# Patient Record
Sex: Female | Born: 1980 | Race: White | Hispanic: No | Marital: Married | Smoking: Current every day smoker
Health system: Southern US, Community
[De-identification: ages and names within clinical notes are randomized; demographics above are authoritative.]

## PROBLEM LIST (undated history)

## (undated) DIAGNOSIS — K219 Gastro-esophageal reflux disease without esophagitis: Secondary | ICD-10-CM

## (undated) DIAGNOSIS — F419 Anxiety disorder, unspecified: Secondary | ICD-10-CM

## (undated) DIAGNOSIS — M1990 Unspecified osteoarthritis, unspecified site: Secondary | ICD-10-CM

## (undated) DIAGNOSIS — G8929 Other chronic pain: Secondary | ICD-10-CM

## (undated) DIAGNOSIS — Z9289 Personal history of other medical treatment: Secondary | ICD-10-CM

## (undated) DIAGNOSIS — F329 Major depressive disorder, single episode, unspecified: Secondary | ICD-10-CM

## (undated) DIAGNOSIS — J302 Other seasonal allergic rhinitis: Secondary | ICD-10-CM

## (undated) DIAGNOSIS — F319 Bipolar disorder, unspecified: Secondary | ICD-10-CM

## (undated) DIAGNOSIS — E782 Mixed hyperlipidemia: Secondary | ICD-10-CM

## (undated) DIAGNOSIS — Z8709 Personal history of other diseases of the respiratory system: Secondary | ICD-10-CM

## (undated) DIAGNOSIS — M25569 Pain in unspecified knee: Secondary | ICD-10-CM

## (undated) DIAGNOSIS — K2970 Gastritis, unspecified, without bleeding: Secondary | ICD-10-CM

## (undated) HISTORY — PX: TUBAL LIGATION: SHX77

## (undated) HISTORY — DX: Mixed hyperlipidemia: E78.2

## (undated) HISTORY — DX: Other seasonal allergic rhinitis: J30.2

## (undated) HISTORY — DX: Personal history of other medical treatment: Z92.89

## (undated) HISTORY — DX: Bipolar disorder, unspecified: F31.9

## (undated) HISTORY — DX: Other chronic pain: G89.29

## (undated) HISTORY — DX: Personal history of other diseases of the respiratory system: Z87.09

## (undated) HISTORY — PX: DENTAL RESTORATION/EXTRACTION WITH X-RAY: SHX5796

## (undated) HISTORY — DX: Gastro-esophageal reflux disease without esophagitis: K21.9

## (undated) HISTORY — PX: ABDOMINAL HYSTERECTOMY: SHX81

## (undated) HISTORY — DX: Gastritis, unspecified, without bleeding: K29.70

---
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
## 2010-09-08 ENCOUNTER — Emergency Department (HOSPITAL_COMMUNITY)
Admission: EM | Admit: 2010-09-08 | Discharge: 2010-09-08 | Disposition: A | Discharge status: Home / Self Care | Payer: PRIVATE HEALTH INSURANCE | Attending: Emergency Medicine | Admitting: Emergency Medicine

## 2010-09-08 DIAGNOSIS — A499 Bacterial infection, unspecified: Secondary | ICD-10-CM | POA: Insufficient documentation

## 2010-09-08 DIAGNOSIS — B9689 Other specified bacterial agents as the cause of diseases classified elsewhere: Secondary | ICD-10-CM | POA: Insufficient documentation

## 2010-09-08 DIAGNOSIS — N949 Unspecified condition associated with female genital organs and menstrual cycle: Secondary | ICD-10-CM | POA: Insufficient documentation

## 2010-09-08 DIAGNOSIS — N760 Acute vaginitis: Secondary | ICD-10-CM | POA: Insufficient documentation

## 2010-09-08 LAB — BASIC METABOLIC PANEL
BUN: 18 mg/dL (ref 6–23)
Calcium: 9.9 mg/dL (ref 8.4–10.5)
GFR calc Af Amer: >60 mL/min (ref >60)
GFR calc non Af Amer: >60 mL/min (ref >60)
Glucose, Bld: 95 mg/dL (ref 70–99)
Potassium: 3.8 mEq/L (ref 3.5–5.1)
Sodium: 135 mEq/L (ref 135–145)

## 2010-09-08 LAB — DIFFERENTIAL
Basophils Absolute: 0 10*3/uL (ref 0.0–0.1)
Basophils Relative: 0 % (ref 0–1)
Eosinophils Relative: 5 % (ref 0–5)
Monocytes Absolute: 0.7 10*3/uL (ref 0.1–1.0)
Monocytes Relative: 7 % (ref 3–12)
Neutro Abs: 6.8 10*3/uL (ref 1.7–7.7)

## 2010-09-08 LAB — CBC
HCT: 41.7 % (ref 36.0–46.0)
Hemoglobin: 14.3 g/dL (ref 12.0–15.0)
MCH: 30.5 pg (ref 26.0–34.0)
MCHC: 34.3 g/dL (ref 30.0–36.0)
RDW: 12.5 % (ref 11.5–15.5)

## 2010-09-08 LAB — URINALYSIS, ROUTINE W REFLEX MICROSCOPIC
Bilirubin Urine: NEGATIVE
Ketones, ur: NEGATIVE mg/dL
Leukocytes, UA: NEGATIVE
Nitrite: NEGATIVE
Protein, ur: NEGATIVE mg/dL

## 2010-09-09 LAB — GC/CHLAMYDIA PROBE AMP, GENITAL: Chlamydia, DNA Probe: NEGATIVE

## 2014-07-31 ENCOUNTER — Emergency Department (HOSPITAL_COMMUNITY)
Admission: EM | Admit: 2014-07-31 | Discharge: 2014-07-31 | Disposition: A | Discharge status: Home / Self Care | Payer: Medicare Other | Attending: Emergency Medicine | Admitting: Emergency Medicine

## 2014-07-31 ENCOUNTER — Encounter (HOSPITAL_COMMUNITY): Payer: Self-pay | Admitting: *Deleted

## 2014-07-31 DIAGNOSIS — M25562 Pain in left knee: Secondary | ICD-10-CM | POA: Insufficient documentation

## 2014-07-31 DIAGNOSIS — M25462 Effusion, left knee: Secondary | ICD-10-CM | POA: Insufficient documentation

## 2014-07-31 DIAGNOSIS — Z720 Tobacco use: Secondary | ICD-10-CM | POA: Diagnosis not present

## 2014-07-31 DIAGNOSIS — G8929 Other chronic pain: Secondary | ICD-10-CM | POA: Insufficient documentation

## 2014-07-31 DIAGNOSIS — M25561 Pain in right knee: Secondary | ICD-10-CM | POA: Diagnosis not present

## 2014-07-31 HISTORY — DX: Other chronic pain: G89.29

## 2014-07-31 HISTORY — DX: Pain in unspecified knee: M25.569

## 2014-07-31 MED ORDER — TRAMADOL HCL 50 MG PO TABS
50.0000 mg | ORAL_TABLET | Freq: Once | ORAL | Status: AC
  Administered 2014-07-31: 50 mg via ORAL
  Filled 2014-07-31: qty 1

## 2014-07-31 MED ORDER — IBUPROFEN 600 MG PO TABS: 600.0000 mg | ORAL_TABLET | Freq: Four times a day (QID) | ORAL | Status: DC | PRN

## 2014-07-31 MED ORDER — TRAMADOL HCL 50 MG PO TABS: 50.0000 mg | ORAL_TABLET | Freq: Four times a day (QID) | ORAL | Status: DC | PRN

## 2014-07-31 NOTE — ED Provider Notes (Signed)
CSN: [id number]     Arrival date & time 07/31/14  1242 History  This chart was scribed for non-physician practitioner, Burgess Amor, PA-C working with No att. providers found by Placido Sou, ED scribe. This patient was seen in room APFT22/APFT22 and the patient's care was started at 1:40 PM.   Chief Complaint  Patient presents with  . Knee Pain   The history is provided by the patient. No language interpreter was used.     HPI Comments: Courtney Patterson is a 34 y.o. female with a Hx of chronic knee pain who presents to the Emergency Department complaining of constant, moderate, throbbing bilateral knee pain on the medial aspects of both knees, left worse than right, with onset 5 years prior. She blames her chronic pain on old sports injuries while in high school.  Pt complains of slight edema and intermittent burning elbow pain as associated symptom. Her pain is aggravated by standing and ambulation but subsides once sitting or lying down. Pt's husband massages joints which aggravates pain.  She has tried 800 mg ibuprofen and tylenol arthritis with minimal improvements in pain. Pt used both knee brace and crutches in past with little improvement. Pt was seen by her PCP 3 months prior who noted swelling but xrays completed in his office were normal.  She was told she probably has a ligament injury. Pt is currently unemployed due to back injuries and is on Medicaid. Pt denies new or recent injuries as well as numbness as an associated symptom.   PCP at [facility] in [place].    Past Medical History  Diagnosis Date  . Knee pain, chronic    Past Surgical History  Procedure Laterality Date  . Tubal ligation    . Dental restoration/extraction with x-ray     No family history on file. History  Substance Use Topics  . Smoking status: Current Every Day Smoker  . Smokeless tobacco: Not on file  . Alcohol Use: No   OB History    No data available     Review of Systems   Constitutional: Negative for fever.  Musculoskeletal: Positive for joint swelling and arthralgias. Negative for myalgias.  Neurological: Negative for weakness and numbness.      Allergies  Review of patient's allergies indicates no known allergies.  Home Medications   Prior to Admission medications   Medication Sig Start Date End Date Taking? Authorizing Provider  ibuprofen (ADVIL,MOTRIN) 600 MG tablet Take 1 tablet (600 mg total) by mouth every 6 (six) hours as needed. 07/31/14   Burgess Amor, PA-C  traMADol (ULTRAM) 50 MG tablet Take 1 tablet (50 mg total) by mouth every 6 (six) hours as needed. 07/31/14   Burgess Amor, PA-C   BP 107/70 mmHg  Pulse 95  Temp(Src) 97.9 F (36.6 C) (Oral)  Resp 18  Ht  (1.905 m)  Wt 140 lb (63.504 kg)  BMI 17.50 kg/m2  SpO2 100%  LMP 07/23/2014 Physical Exam  Constitutional: She appears well-developed and well-nourished.  HENT:  Head: Atraumatic.  Neck: Normal range of motion.  Cardiovascular:  Pulses equal bilaterally  Musculoskeletal: She exhibits tenderness.       Right knee: She exhibits no swelling, no effusion, no erythema, no LCL laxity and no MCL laxity. Tenderness found. Medial joint line tenderness noted. No patellar tendon tenderness noted.       Left knee: She exhibits swelling. She exhibits no effusion, no erythema, no LCL laxity and no MCL laxity. Tenderness found. Medial  joint line tenderness noted. No patellar tendon tenderness noted.  Mild crepitus with ROM to her left knee  Neurological: She is alert. She has normal strength. She displays normal reflexes. No sensory deficit.  Skin: Skin is warm and dry.  Psychiatric: She has a normal mood and affect.  Nursing note and vitals reviewed.   ED Course  Procedures   DIAGNOSTIC STUDIES: Oxygen Saturation is 100% on RA, normal by my interpretation.    COORDINATION OF CARE: 1:59 PM Discussed treatment plan with pt at bedside including 600 mg ibuprofen, applying heating pad  and additionally seeing an orthopedist. Pt agreed to plan.  Labs Review Labs Reviewed - No data to display  Imaging Review No results found.   EKG Interpretation None      MDM   Final diagnoses:  Bilateral chronic knee pain    Chronic knee pain with exam suggesting possible meniscal injury, exp with medial ttp, crepitus with ROM. No obvious disruption appreciated.  She was encouraged heat tx, continue with ibuprofen, tramadol also prescribed. Pt was referred to ortho for further management. Encouraged prn crutch use which she owns.  The patient appears reasonably screened and/or stabilized for discharge and I doubt any other medical condition or other [facility] requiring further screening, evaluation, or treatment in the ED at this time prior to discharge.   I personally performed the services described in this documentation, which was scribed in my presence. The recorded information has been reviewed and is accurate.   Burgess AmorJulie Lodema Parma, PA-C 07/31/14 1707  Linwood DibblesJon Knapp, MD 08/03/14 [phone]

## 2014-07-31 NOTE — ED Notes (Signed)
Pt c/o bilateral knee pain, states that she has hx of chronic knee pain and pain became worse two days ago, denies any injury, pt ambulatory to triage, cms intact distal

## 2014-07-31 NOTE — Discharge Instructions (Signed)

## 2014-09-06 ENCOUNTER — Other Ambulatory Visit (HOSPITAL_COMMUNITY): Payer: Self-pay | Admitting: Orthopaedic Surgery

## 2014-09-06 DIAGNOSIS — M25562 Pain in left knee: Secondary | ICD-10-CM

## 2014-09-19 ENCOUNTER — Ambulatory Visit (HOSPITAL_COMMUNITY)
Admission: RE | Admit: 2014-09-19 | Discharge: 2014-09-19 | Disposition: A | Discharge status: Home / Self Care | Payer: Medicare Other | Source: Ambulatory Visit | Attending: Orthopaedic Surgery | Admitting: Orthopaedic Surgery

## 2014-09-19 DIAGNOSIS — S83242A Other tear of medial meniscus, current injury, left knee, initial encounter: Secondary | ICD-10-CM | POA: Insufficient documentation

## 2014-09-19 DIAGNOSIS — X58XXXA Exposure to other specified factors, initial encounter: Secondary | ICD-10-CM | POA: Diagnosis not present

## 2014-09-19 DIAGNOSIS — M25562 Pain in left knee: Secondary | ICD-10-CM | POA: Insufficient documentation

## 2014-11-06 ENCOUNTER — Encounter (HOSPITAL_COMMUNITY): Payer: Self-pay | Admitting: Cardiology

## 2014-11-06 ENCOUNTER — Emergency Department (HOSPITAL_COMMUNITY)
Admission: EM | Admit: 2014-11-06 | Discharge: 2014-11-06 | Disposition: A | Discharge status: Home / Self Care | Payer: Medicare Other | Attending: Emergency Medicine | Admitting: Emergency Medicine

## 2014-11-06 DIAGNOSIS — Y9389 Activity, other specified: Secondary | ICD-10-CM | POA: Insufficient documentation

## 2014-11-06 DIAGNOSIS — G8929 Other chronic pain: Secondary | ICD-10-CM | POA: Diagnosis not present

## 2014-11-06 DIAGNOSIS — M7031 Other bursitis of elbow, right elbow: Secondary | ICD-10-CM | POA: Diagnosis not present

## 2014-11-06 DIAGNOSIS — Z720 Tobacco use: Secondary | ICD-10-CM | POA: Diagnosis not present

## 2014-11-06 DIAGNOSIS — M7021 Olecranon bursitis, right elbow: Secondary | ICD-10-CM

## 2014-11-06 DIAGNOSIS — M222X1 Patellofemoral disorders, right knee: Secondary | ICD-10-CM

## 2014-11-06 DIAGNOSIS — M25521 Pain in right elbow: Secondary | ICD-10-CM | POA: Diagnosis present

## 2014-11-06 MED ORDER — HYDROCODONE-ACETAMINOPHEN 5-325 MG PO TABS: ORAL_TABLET | ORAL | Status: DC

## 2014-11-06 MED ORDER — NAPROXEN 500 MG PO TABS: 500.0000 mg | ORAL_TABLET | Freq: Two times a day (BID) | ORAL | Status: DC

## 2014-11-06 NOTE — ED Notes (Signed)
Pt verbalized understanding of no driving and to use caution within 4 hours of taking pain meds due to meds cause drowsiness 

## 2014-11-06 NOTE — ED Notes (Signed)
Right knee and right elbow pain for a while.  No injury.

## 2014-11-06 NOTE — ED Provider Notes (Signed)
CSN: [id number]     Arrival date & time 11/06/14  1818 History  This chart was scribed for non-physician practitioner, Pauline Aus, PA-C working with Samuel Jester, DO, by Jarvis Morgan, ED Scribe. This patient was seen in room APFT24/APFT24 and the patient's care was started at 7:28 PM.      Chief Complaint  Patient presents with  . Knee Pain  . Elbow Pain    The history is provided by the patient. No language interpreter was used.    HPI Comments: Courtney Patterson is a 34 y.o. female with a h/o chronic knee pain who presents to the Emergency Department complaining of chronic, intermittent, gradually worsening, moderate, bilateral knee pain. She states the right knee is worse than the right. She had an MRI done on her right knee, ordered by Dr. Hilda Lias, but states she has not gotten the results back yet. Pt notes she has not been able to follow up with him lately. She reports the pain is worse with bending down and movement of her knee. Pt admits to intermittent popping sound in knees when walking up and down stairs. She denies any new injury. She denies any weakness or numbness, redness or swelling.  Pt has a second complaint of constant, moderate, burning, right elbow pain onset several days. The pain is non radiating. She is right hand dominant. She denies any new injuries. She states the pain is exacerbated with trying to pick up objects. She denies any numbness or weakness in the elbow or upper extremities.   Past Medical History  Diagnosis Date  . Knee pain, chronic    Past Surgical History  Procedure Laterality Date  . Tubal ligation    . Dental restoration/extraction with x-ray     History reviewed. No pertinent family history. Social History  Substance Use Topics  . Smoking status: Current Every Day Smoker  . Smokeless tobacco: None  . Alcohol Use: No   OB History    No data available     Review of Systems  Musculoskeletal: Positive for myalgias and arthralgias.  Negative for joint swelling and gait problem.  Neurological: Negative for weakness and numbness.  All other systems reviewed and are negative.     Allergies  Review of patient's allergies indicates no known allergies.  Home Medications   Prior to Admission medications   Medication Sig Start Date End Date Taking? Authorizing Provider  ibuprofen (ADVIL,MOTRIN) 600 MG tablet Take 1 tablet (600 mg total) by mouth every 6 (six) hours as needed. 07/31/14   Burgess Amor, PA-C  traMADol (ULTRAM) 50 MG tablet Take 1 tablet (50 mg total) by mouth every 6 (six) hours as needed. 07/31/14   Burgess Amor, PA-C   Triage Vitals: BP 98/75 mmHg  Pulse 69  Temp(Src) 98.1 F (36.7 C) (Oral)  Resp 20  Ht 6\' 3"  (1.905 m)  Wt 145 lb (65.772 kg)  BMI 18.12 kg/m2  SpO2 100%  LMP 11/05/2014  Physical Exam  Constitutional: She is oriented to person, place, and time. She appears well-developed and well-nourished. No distress.  HENT:  Head: Normocephalic and atraumatic.  Eyes: Conjunctivae and EOM are normal.  Neck: Neck supple. No tracheal deviation present.  Cardiovascular: Normal rate.   Pulmonary/Chest: Effort normal. No respiratory distress.  Musculoskeletal: Normal range of motion.       Right elbow: She exhibits no effusion. Tenderness (ulnar aspect) found.       Right knee: She exhibits no effusion and no erythema.  Left knee: Normal.  Pain with ROM of right knee. Patellar crepitus is present  Neurological: She is alert and oriented to person, place, and time.  Skin: Skin is warm and dry.  Psychiatric: She has a normal mood and affect. Her behavior is normal.  Nursing note and vitals reviewed.   ED Course  Procedures (including critical care time)  DIAGNOSTIC STUDIES: Oxygen Saturation is 100% on RA, normal by my interpretation.    COORDINATION OF CARE: 7:55 PM Pt advised of plan for treatment and pt agrees. Advised f/u with Dr. Hilda Lias and to keep alternating heat and ice to help  with pain and inflammation     Labs Review Labs Reviewed - No data to display   EKG Interpretation None      MDM   Final diagnoses:  Patellofemoral disorder of right knee  Bursitis of elbow, right    Pt is well appearing, no concerning sx's for septic joint.  Pt agrees to symptomatic tx and close PMD or orthopedic f/u.  Vitals reviewed, she appears stable for d/c   I personally performed the services described in this documentation, which was scribed in my presence. The recorded information has been reviewed and is accurate.      Pauline Aus, PA-C 11/08/14 1850  Samuel Jester, DO 11/11/14 0006

## 2014-11-06 NOTE — Discharge Instructions (Signed)
Patellofemoral Syndrome If you have had pain in the front of your knee for a long time, chances are good that you have patellofemoral syndrome. The word patella refers to the kneecap. Femoral (or femur) refers to the thigh bone. That is the bone the kneecap sits on. The kneecap is shaped like a triangle. Its job is to protect the knee and to improve the efficiency of your thigh muscles (quadriceps). The underside of the kneecap is made of smooth tissue (cartilage). This lets the kneecap slide up and down as the knee moves. Sometimes this cartilage becomes soft. Your healthcare provider may say the cartilage breaks down. That is patellofemoral syndrome. It can affect one knee, or both. The condition is sometimes called patellofemoral pain syndrome. That is because the condition is painful. The pain usually gets worse with activity. Sitting for a long time with the knee bent also makes the pain worse. It usually gets better with rest and proper treatment. CAUSES  No one is sure why some people develop this problem and others do not. Runners often get it. One name for the condition is "runner's knee." However, some people run for years and never have knee pain. Certain things seem to make patellofemoral syndrome more likely. They include:  Moving out of alignment. The kneecap is supposed to move in a straight line when the thigh muscle pulls on it. Sometimes the kneecap moves in poor alignment. That can make the knee swell and hurt. Some experts believe it also wears down the cartilage.  Injury to the kneecap.  Strain on the knee. This may occur during sports activity. Soccer, running, skiing and cycling can put excess stress on the knee.  Being flat-footed or knock-kneed. SYMPTOMS   Knee pain.  Pain under the kneecap. This is usually a dull, aching pain.  Pain in the knee when doing certain things: squatting, kneeling, going up or down stairs.  Pain in the knee when you stand up after sitting down  for awhile.  Tightness in the knee.  Loss of muscle strength in the thigh.  Swelling of the knee. DIAGNOSIS  Healthcare providers often send people with knee pain to an orthopedic caregiver. This person has special training to treat problems with bones and joints. To decide what is causing your knee pain, your caregiver will probably:  Do a physical exam. This will probably include:  Asking about symptoms you have noticed.  Asking about your activities and any injuries.  Feeling your knee. Moving it. This will help test the knee's strength. It will also check alignment (whether the knee and leg are aligned normally).  Order some tests, such as:  Imaging tests. They create pictures of the inside of the knee. Tests may include:  X-rays.  Computed tomography (CT) scan. This uses X-rays and a computer to show more detail.  Magnetic resonance imaging (MRI). This test uses magnets, radio waves and a computer to make pictures. TREATMENT   Medication is almost always used first. It can relieve pain. It also can reduce swelling. Non-steroidal anti-inflammatory medicines (called NSAIDs) are usually suggested. Sometimes a stronger form is needed. A stronger form would require a prescription.  Other treatment may be needed after the swelling goes down. Possibilities include:  Exercise. Certain exercises can make the muscles around the knee stronger which decreases the pressure on the knee cap. This includes the thigh muscle. Certain exercises also may be suggested to increase your flexibility.  A knee brace. This gives the knee extra support   and helps align the movement of the knee cap.  Orthotics. These are special shoe inserts. They can help keep your leg and knee aligned.  Surgery is sometimes needed. This is rare. Options include:  Arthroscopy. The surgeon uses a special tool to remove any damaged pieces of the kneecap. Only a few small incisions (cuts) are needed.  Realignment.  This is open surgery. The goals are to reduce pressure and fix the way the kneecap moves. HOME CARE INSTRUCTIONS   Take any medication prescribed by your healthcare provider. Follow the directions carefully.  If your knee is swollen:  Put ice or cold packs on it. Do this for 20 to 30 minutes, 3 to 4 times a day.  Keep the knee raised. Make sure it is supported. Put a pillow under it.  Rest your knee. For example, take the elevator instead of the stairs for awhile. Or, take a break from sports activity that strain your knee. Try walking or swimming instead.  Whenever you are active:  Use an elastic bandage on your knee. This gives it support.  After any activity, put ice or cold packs on your knees. Do this for about 10 to 20 minutes.  Make sure you wear shoes that give good support. Make sure they are not worn down. The heels should not slant in or out. SEEK MEDICAL CARE IF:   Knee pain gets worse. Or it does not go away, even after taking pain medicine.  Swelling does not go down.  Your thigh muscle becomes weak.  You have an oral temperature above 102 F (38.9 C). SEEK IMMEDIATE MEDICAL CARE IF:  You have an oral temperature above 102 F (38.9 C), not controlled by medicine. Document Released: 02/12/2009 Document Revised: 05/19/2011 Document Reviewed: 05/16/2013 ExitCare Patient Information 2015 [employer]. This information is not intended to replace advice given to you by your health care provider. Make sure you discuss any questions you have with your health care provider.  

## 2014-12-19 ENCOUNTER — Other Ambulatory Visit (HOSPITAL_COMMUNITY): Payer: Self-pay | Admitting: Orthopaedic Surgery

## 2014-12-19 DIAGNOSIS — M25561 Pain in right knee: Secondary | ICD-10-CM

## 2015-01-10 ENCOUNTER — Other Ambulatory Visit: Payer: Self-pay | Admitting: Orthopaedic Surgery

## 2015-01-19 NOTE — Patient Instructions (Signed)
Courtney Patterson  01/19/2015     @PREFPERIOPPHARMACY @   Your procedure is scheduled on  01/25/2015   Report to [facility] at  710  A.M.  Call this number if you have problems the morning of surgery:  [phone]   Remember:  Do not eat food or drink liquids after midnight.  Take these medicines the morning of surgery with A SIP OF WATER  norco (or ultram).   Do not wear jewelry, make-up or nail polish.  Do not wear lotions, powders, or perfumes.  You may wear deodorant.  Do not shave 48 hours prior to surgery.  Men may shave face and neck.  Do not bring valuables to the hospital.  [facility] not responsible [facility] any belongings or valuables.  Contacts, dentures or bridgework may not be worn into surgery.  Leave your suitcase in the car.  After surgery it may be brought to your room.  For patients admitted to the hospital, discharge time will be determined by your treatment team.  Patients discharged the day of surgery will not be allowed to drive home.   Name and phone number of your driver:   family Special instructions:  none  Please read over the following fact sheets that you were given. Pain Booklet, Coughing and Deep Breathing, MRSA Information, Surgical Site Infection Prevention and Anesthesia Post-op Instructions      Meniscus Injury, Arthroscopy Arthroscopy is a surgical procedure that involves the use of a small scope that has a camera and surgical instruments on the end (arthroscope). An arthroscope can be used to repair your meniscus injury.  LET [facility]ARE PROVIDER KNOW ABOUT:  Any allergies you have.  All medicines you are taking, including vitamins, herbs, eyedrops, creams, and over-the-counter medicines.  Any recent colds or infections you have had or currently have.  Previous problems you or members of your family have had with the use of anesthetics.  Any blood disorders or blood clotting problems you have.  Previous surgeries you  have had.  Medical conditions you have. RISKS AND COMPLICATIONS Generally, this is a safe procedure. However, as with any procedure, problems can occur. Possible problems include:  Damage to nerves or blood vessels.  Excess bleeding.  Blood clots.  Infection. BEFORE THE PROCEDURE  Do not eat or drink for 6-8 hours before the procedure.  Take medicines as directed by your surgeon. Ask your surgeon about changing or stopping your regular medicines.  You may have lab tests the morning of surgery. PROCEDURE  You will be given one of the following:   A medicine that numbs the area (local anesthesia).  A medicine that makes you go to sleep (general anesthesia).  A medicine injected into your spine that numbs your body below the waist (spinal anesthesia). Most often, several small cuts (incisions) are made in the knee. The arthroscope and instruments go into the incisions to repair the damage. The torn portion of the meniscus is removed.  During this time, your surgeon may find a partial or complete tear in a cruciate ligament, such as the anterior cruciate ligament (ACL). A completely torn cruciate ligament is reconstructed by taking tissue from another part of the body (grafting) and placing it into the injured area. This requires several larger incisions to complete the repair. Sometimes, open surgery is needed for collateral ligament injuries. If a collateral ligament is found to be injured, your surgeon may staple or suture the tear through a slightly  larger incision on the side of the knee. AFTER THE PROCEDURE You will be taken to the recovery area where your progress will be monitored. When you are awake, stable, and taking fluids without complications, you will be allowed to go home. This is usually the same day. However, more extensive repairs of a ligament may require an overnight stay.  The recovery time after repairing your meniscus or ligament depends on the amount of damage to  these structures. It also depends on whether or not reconstructive knee surgery was needed.   A torn or stretched ligament (ligament sprain) may take 6-8 weeks to heal. It takes about the same amount of time if your surgeon removed a torn meniscus.  A repaired meniscus may require 6-12 weeks of recovery time.  A torn ligament needing reconstructive surgery may take 6-12 months to heal fully.   This information is not intended to replace advice given to you by your health care provider. Make sure you discuss any questions you have with your health care provider.   Document Released: 02/22/2000 Document Revised: 03/01/2013 Document Reviewed: 07/23/2012 Elsevier Interactive Patient Education 2016 [employer] PATIENT INSTRUCTIONS POST-ANESTHESIA  IMMEDIATELY FOLLOWING SURGERY:  Do not drive or operate machinery for the first twenty four hours after surgery.  Do not make any important decisions for twenty four hours after surgery or while taking narcotic pain medications or sedatives.  If you develop intractable nausea and vomiting or a severe headache please notify your doctor immediately.  FOLLOW-UP:  Please make an appointment with your surgeon as instructed. You do not need to follow up with anesthesia unless specifically instructed to do so.  WOUND CARE INSTRUCTIONS (if applicable):  Keep a dry clean dressing on the anesthesia/puncture wound site if there is drainage.  Once the wound has quit draining you may leave it open to air.  Generally you should leave the bandage intact for twenty four hours unless there is drainage.  If the epidural site drains for more than 36-48 hours please call the anesthesia department.  QUESTIONS?:  Please feel free to call your physician or the hospital operator if you have any questions, and they will be happy to assist you.

## 2015-01-22 ENCOUNTER — Encounter (HOSPITAL_COMMUNITY)
Admission: RE | Admit: 2015-01-22 | Discharge: 2015-01-22 | Disposition: A | Discharge status: Home / Self Care | Payer: Medicare Other | Source: Ambulatory Visit | Attending: Orthopaedic Surgery | Admitting: Orthopaedic Surgery

## 2015-01-22 ENCOUNTER — Encounter (HOSPITAL_COMMUNITY): Payer: Self-pay

## 2015-01-22 DIAGNOSIS — Z01812 Encounter for preprocedural laboratory examination: Secondary | ICD-10-CM | POA: Diagnosis not present

## 2015-01-22 DIAGNOSIS — Z791 Long term (current) use of non-steroidal anti-inflammatories (NSAID): Secondary | ICD-10-CM | POA: Diagnosis not present

## 2015-01-22 DIAGNOSIS — M23221 Derangement of posterior horn of medial meniscus due to old tear or injury, right knee: Secondary | ICD-10-CM | POA: Diagnosis not present

## 2015-01-22 DIAGNOSIS — M23203 Derangement of unspecified medial meniscus due to old tear or injury, right knee: Secondary | ICD-10-CM | POA: Diagnosis present

## 2015-01-22 DIAGNOSIS — F17200 Nicotine dependence, unspecified, uncomplicated: Secondary | ICD-10-CM | POA: Diagnosis not present

## 2015-01-22 LAB — URINALYSIS, ROUTINE W REFLEX MICROSCOPIC
Bilirubin Urine: NEGATIVE
Glucose, UA: NEGATIVE mg/dL
Ketones, ur: NEGATIVE mg/dL
LEUKOCYTES UA: NEGATIVE
Nitrite: NEGATIVE
Protein, ur: NEGATIVE mg/dL
SPECIFIC GRAVITY, URINE: 1.015 (ref 1.005–1.030)
UROBILINOGEN UA: 0.2 mg/dL (ref 0.0–1.0)
pH: 5.5 (ref 5.0–8.0)

## 2015-01-22 LAB — CBC
HCT: 40.1 % (ref 36.0–46.0)
Hemoglobin: 13.3 g/dL (ref 12.0–15.0)
MCH: 31.9 pg (ref 26.0–34.0)
MCHC: 33.2 g/dL (ref 30.0–36.0)
MCV: 96.2 fL (ref 78.0–100.0)
PLATELETS: 211 10*3/uL (ref 150–400)
RBC: 4.17 MIL/uL (ref 3.87–5.11)
RDW: 13.7 % (ref 11.5–15.5)
WBC: 13.1 10*3/uL — AB (ref 4.0–10.5)

## 2015-01-22 LAB — COMPREHENSIVE METABOLIC PANEL
ALT: 18 U/L (ref 14–54)
AST: 21 U/L (ref 15–41)
Albumin: 4 g/dL (ref 3.5–5.0)
Alkaline Phosphatase: 56 U/L (ref 38–126)
Anion gap: 5 (ref 5–15)
BUN: 17 mg/dL (ref 6–20)
CHLORIDE: 103 mmol/L (ref 101–111)
CO2: 28 mmol/L (ref 22–32)
CREATININE: 0.68 mg/dL (ref 0.44–1.00)
Calcium: 8.7 mg/dL — ABNORMAL LOW (ref 8.9–10.3)
GFR calc Af Amer: >60 mL/min (ref >60)
GFR calc non Af Amer: >60 mL/min (ref >60)
Glucose, Bld: 77 mg/dL (ref 65–99)
POTASSIUM: 4 mmol/L (ref 3.5–5.1)
SODIUM: 136 mmol/L (ref 135–145)
Total Bilirubin: 0.3 mg/dL (ref 0.3–1.2)
Total Protein: 6.8 g/dL (ref 6.5–8.1)

## 2015-01-22 LAB — URINE MICROSCOPIC-ADD ON

## 2015-01-22 LAB — HCG, SERUM, QUALITATIVE: PREG SERUM: NEGATIVE

## 2015-01-24 NOTE — H&P (Signed)
Courtney Patterson is an 34 y.o. female.   Chief Complaint:  Right knee pain HPI: she has had pain and tenderness of the right knee going back about two years.   It has had swelling and popping.  Over the last several months the pain got worse and she had more swelling.  She has been on NSAIDs and modification of her activities.She has had giving way also.  [facility] shows a complex tear of the medial meniscus of the right knee.  I have recommended arthroscopy of the knee and partial medial menisectomy.  I have explained the procedure the risks and imponderables to her.  She asked appropriate questions and agrees to the out patient procedure.   Past Medical History  Diagnosis Date  . Knee pain, chronic     Past Surgical History  Procedure Laterality Date  . Tubal ligation    . Dental restoration/extraction with x-ray      No family history on file. Social History:  reports that she has been smoking.  She does not have any smokeless tobacco history on file. She reports that she uses illicit drugs (Marijuana). She reports that she does not drink alcohol.  Allergies: No Known Allergies  No prescriptions prior to admission    No results found for this or any previous visit (from the past 48 hour(s)). No results found.  Review of Systems  Musculoskeletal: Positive for joint pain (Pain of the right knee with giving way for over two years.).    There were no vitals taken for this visit. Physical Exam  Constitutional: She is oriented to person, place, and time. She appears well-developed and well-nourished.  HENT:  Head: Normocephalic and atraumatic.  Eyes: Conjunctivae and EOM are normal. Pupils are equal, round, and reactive to light.  Neck: Normal range of motion. Neck supple.  Cardiovascular: Normal rate, regular rhythm, normal heart sounds and intact distal pulses.   Respiratory: Effort normal and breath sounds normal.  GI: Soft.  Musculoskeletal: She exhibits  tenderness (Pain right knee with medial positive McMurray, effusion, medial joint line pain, ROM 0 to 105).  Neurological: She is alert and oriented to person, place, and time. She has normal reflexes.  Skin: Skin is warm and dry.  Psychiatric: She has a normal mood and affect. Her behavior is normal. Judgment and thought content normal.     Assessment/Plan Tear of the right knee medial meniscus.  For outpatient arthroscopy.  Damyn Weitzel 01/24/2015, 3:41 PM

## 2015-01-25 ENCOUNTER — Encounter (HOSPITAL_COMMUNITY): Payer: Self-pay | Admitting: *Deleted

## 2015-01-25 ENCOUNTER — Ambulatory Visit (HOSPITAL_COMMUNITY)
Admission: RE | Admit: 2015-01-25 | Discharge: 2015-01-25 | Disposition: A | Discharge status: Home / Self Care | Payer: Medicare Other | Source: Ambulatory Visit | Attending: Orthopaedic Surgery | Admitting: Orthopaedic Surgery

## 2015-01-25 ENCOUNTER — Ambulatory Visit (HOSPITAL_COMMUNITY): Payer: Medicare Other | Admitting: Anesthesiology

## 2015-01-25 ENCOUNTER — Encounter (HOSPITAL_COMMUNITY)
Admission: RE | Disposition: A | Discharge status: Home / Self Care | Payer: Self-pay | Source: Ambulatory Visit | Attending: Orthopaedic Surgery

## 2015-01-25 DIAGNOSIS — Z791 Long term (current) use of non-steroidal anti-inflammatories (NSAID): Secondary | ICD-10-CM | POA: Insufficient documentation

## 2015-01-25 DIAGNOSIS — Z01812 Encounter for preprocedural laboratory examination: Secondary | ICD-10-CM | POA: Diagnosis not present

## 2015-01-25 DIAGNOSIS — M23221 Derangement of posterior horn of medial meniscus due to old tear or injury, right knee: Secondary | ICD-10-CM | POA: Diagnosis not present

## 2015-01-25 DIAGNOSIS — F17200 Nicotine dependence, unspecified, uncomplicated: Secondary | ICD-10-CM | POA: Insufficient documentation

## 2015-01-25 HISTORY — PX: KNEE ARTHROSCOPY: SHX127

## 2015-01-25 HISTORY — PX: KNEE ARTHROSCOPY WITH MEDIAL MENISECTOMY: SHX5651

## 2015-01-25 SURGERY — ARTHROSCOPY, KNEE
Anesthesia: General | Laterality: Right

## 2015-01-25 MED ORDER — MIDAZOLAM HCL 2 MG/2ML IJ SOLN
1.0000 mg | INTRAMUSCULAR | Status: DC | PRN
  Administered 2015-01-25: 2 mg via INTRAVENOUS

## 2015-01-25 MED ORDER — MIDAZOLAM HCL 5 MG/5ML IJ SOLN
INTRAMUSCULAR | Status: DC | PRN
  Administered 2015-01-25: 2 mg via INTRAVENOUS

## 2015-01-25 MED ORDER — ONDANSETRON HCL 4 MG/2ML IJ SOLN
INTRAMUSCULAR | Status: AC
  Filled 2015-01-25: qty 2

## 2015-01-25 MED ORDER — BUPIVACAINE HCL (PF) 0.25 % IJ SOLN
INTRAMUSCULAR | Status: AC
  Filled 2015-01-25: qty 30

## 2015-01-25 MED ORDER — FENTANYL CITRATE (PF) 100 MCG/2ML IJ SOLN
INTRAMUSCULAR | Status: AC
  Filled 2015-01-25: qty 4

## 2015-01-25 MED ORDER — LIDOCAINE HCL (PF) 1 % IJ SOLN
INTRAMUSCULAR | Status: AC
  Filled 2015-01-25: qty 5

## 2015-01-25 MED ORDER — CHLORHEXIDINE GLUCONATE 4 % EX LIQD: 60.0000 mL | Freq: Once | CUTANEOUS | Status: DC

## 2015-01-25 MED ORDER — FENTANYL CITRATE (PF) 100 MCG/2ML IJ SOLN
25.0000 ug | INTRAMUSCULAR | Status: DC | PRN
  Administered 2015-01-25: 50 ug via INTRAVENOUS
  Filled 2015-01-25: qty 2

## 2015-01-25 MED ORDER — LIDOCAINE HCL 1 % IJ SOLN
INTRAMUSCULAR | Status: DC | PRN
  Administered 2015-01-25: 30 mg via INTRADERMAL

## 2015-01-25 MED ORDER — FENTANYL CITRATE (PF) 100 MCG/2ML IJ SOLN: INTRAMUSCULAR | Status: DC | PRN

## 2015-01-25 MED ORDER — PROPOFOL 10 MG/ML IV BOLUS
INTRAVENOUS | Status: DC | PRN
  Administered 2015-01-25: 160 mg via INTRAVENOUS

## 2015-01-25 MED ORDER — FENTANYL CITRATE (PF) 100 MCG/2ML IJ SOLN
INTRAMUSCULAR | Status: DC | PRN
  Administered 2015-01-25: 100 ug via INTRAVENOUS
  Administered 2015-01-25 (×2): 50 ug via INTRAVENOUS

## 2015-01-25 MED ORDER — PROPOFOL 10 MG/ML IV BOLUS
INTRAVENOUS | Status: AC
  Filled 2015-01-25: qty 20

## 2015-01-25 MED ORDER — LACTATED RINGERS IR SOLN
Status: DC | PRN
  Administered 2015-01-25 (×3): 3000 mL

## 2015-01-25 MED ORDER — SODIUM CHLORIDE 0.9 % IR SOLN
Status: DC | PRN
Start: 2015-01-25 — End: 2015-01-25
  Administered 2015-01-25: 500 mL

## 2015-01-25 MED ORDER — MIDAZOLAM HCL 2 MG/2ML IJ SOLN
INTRAMUSCULAR | Status: AC
  Filled 2015-01-25: qty 2

## 2015-01-25 MED ORDER — LACTATED RINGERS IV SOLN
INTRAVENOUS | Status: DC
  Administered 2015-01-25: 1000 mL via INTRAVENOUS

## 2015-01-25 MED ORDER — ONDANSETRON HCL 4 MG/2ML IJ SOLN
4.0000 mg | Freq: Once | INTRAMUSCULAR | Status: AC
  Administered 2015-01-25: 4 mg via INTRAVENOUS

## 2015-01-25 MED ORDER — ONDANSETRON HCL 4 MG/2ML IJ SOLN: 4.0000 mg | Freq: Once | INTRAMUSCULAR | Status: DC | PRN

## 2015-01-25 MED ORDER — BUPIVACAINE HCL (PF) 0.25 % IJ SOLN
INTRAMUSCULAR | Status: DC | PRN
Start: 2015-01-25 — End: 2015-01-25
  Administered 2015-01-25: 30 mL

## 2015-01-25 SURGICAL SUPPLY — 56 items
BAG HAMPER (MISCELLANEOUS) ×2 IMPLANT
BANDAGE ELASTIC 6 VELCRO NS (GAUZE/BANDAGES/DRESSINGS) ×2 IMPLANT
BANDAGE ESMARK 4X12 BL STRL LF (DISPOSABLE) ×1 IMPLANT
BLADE SURG 15 STRL LF DISP TIS (BLADE) ×1 IMPLANT
BLADE SURG 15 STRL SS (BLADE) ×1
BLADE SURG SZ11 CARB STEEL (BLADE) ×2 IMPLANT
BNDG ESMARK 4X12 BLUE STRL LF (DISPOSABLE) ×2
CLOTH BEACON ORANGE TIMEOUT ST (SAFETY) ×2 IMPLANT
CUFF TOURNIQUET SINGLE 34IN LL (TOURNIQUET CUFF) ×2 IMPLANT
CUFF TOURNIQUET SINGLE 44IN (TOURNIQUET CUFF) IMPLANT
CUTTER TOMCAT 4.0M (BURR) ×2 IMPLANT
DECANTER SPIKE VIAL GLASS SM (MISCELLANEOUS) ×2 IMPLANT
DRAPE PROXIMA HALF (DRAPES) ×2 IMPLANT
DRSG XEROFORM 1X8 (GAUZE/BANDAGES/DRESSINGS) ×2 IMPLANT
DURAPREP 26ML APPLICATOR (WOUND CARE) ×4 IMPLANT
ELECT MENISCUS 165MM 90D (ELECTRODE) IMPLANT
FORMALIN 10 PREFIL 480ML (MISCELLANEOUS) ×2 IMPLANT
GAUZE SPONGE 4X4 12PLY STRL (GAUZE/BANDAGES/DRESSINGS) ×2 IMPLANT
GAUZE SPONGE 4X4 16PLY XRAY LF (GAUZE/BANDAGES/DRESSINGS) ×2 IMPLANT
GLOVE BIO SURGEON STRL SZ8 (GLOVE) ×2 IMPLANT
GLOVE BIO SURGEON STRL SZ8.5 (GLOVE) ×2 IMPLANT
GLOVE BIOGEL M 7.0 STRL (GLOVE) ×2 IMPLANT
GLOVE BIOGEL PI IND STRL 7.0 (GLOVE) ×1 IMPLANT
GLOVE BIOGEL PI INDICATOR 7.0 (GLOVE) ×1
GLOVE INDICATOR 7.0 STRL GRN (GLOVE) ×4 IMPLANT
GOWN STRL REUS W/TWL LRG LVL3 (GOWN DISPOSABLE) ×2 IMPLANT
GOWN STRL REUS W/TWL XL LVL3 (GOWN DISPOSABLE) ×2 IMPLANT
HANDPIECE (MISCELLANEOUS) IMPLANT
HLDR LEG FOAM (MISCELLANEOUS) ×1 IMPLANT
IV LACTATED RINGER IRRG 3000ML (IV SOLUTION) ×3
IV LR IRRIG 3000ML ARTHROMATIC (IV SOLUTION) ×3 IMPLANT
KIT BLADEGUARD II DBL (SET/KITS/TRAYS/PACK) ×2 IMPLANT
KIT ROOM TURNOVER AP CYSTO (KITS) ×2 IMPLANT
KNIFE HOOK (BLADE) IMPLANT
KNIFE MENISECTOMY (BLADE) IMPLANT
LEG HOLDER FOAM (MISCELLANEOUS) ×1
MANIFOLD NEPTUNE II (INSTRUMENTS) ×2 IMPLANT
MARKER SKIN DUAL TIP RULER LAB (MISCELLANEOUS) ×2 IMPLANT
NEEDLE HYPO 21X1.5 SAFETY (NEEDLE) ×2 IMPLANT
NEEDLE SPNL 18GX3.5 QUINCKE PK (NEEDLE) ×2 IMPLANT
NS IRRIG 1000ML POUR BTL (IV SOLUTION) ×2 IMPLANT
PACK ARTHRO LIMB DRAPE STRL (MISCELLANEOUS) ×2 IMPLANT
PAD ABD 5X9 TENDERSORB (GAUZE/BANDAGES/DRESSINGS) ×2 IMPLANT
PAD ARMBOARD 7.5X6 YLW CONV (MISCELLANEOUS) ×2 IMPLANT
PADDING CAST COTTON 6X4 STRL (CAST SUPPLIES) ×2 IMPLANT
PADDING WEBRIL 6 STERILE (GAUZE/BANDAGES/DRESSINGS) ×2 IMPLANT
SET ARTHROSCOPY INST (INSTRUMENTS) ×2 IMPLANT
SET BASIN LINEN APH (SET/KITS/TRAYS/PACK) ×2 IMPLANT
SPONGE GAUZE 4X4 12PLY (GAUZE/BANDAGES/DRESSINGS) ×2 IMPLANT
SUT ETHILON 3 0 FSL (SUTURE) ×2 IMPLANT
SYR 30ML LL (SYRINGE) ×2 IMPLANT
TIP 0 DEGREE (MISCELLANEOUS) IMPLANT
TIP 30 DEGREE (MISCELLANEOUS) IMPLANT
TIP 70 DEGREE (MISCELLANEOUS) IMPLANT
TUBING FLOSTEADY ARTHRO PUMP (IRRIGATION / IRRIGATOR) ×2 IMPLANT
YANKAUER SUCT BULB TIP 10FT TU (MISCELLANEOUS) ×4 IMPLANT

## 2015-01-25 NOTE — Brief Op Note (Signed)
01/25/2015  8:23 AM  PATIENT:  Thelma BargeCindy Olesen  34 y.o. female  PRE-OPERATIVE DIAGNOSIS:  tear right knee medial meniscus  POST-OPERATIVE DIAGNOSIS:  tear right knee medial meniscus  PROCEDURE:  Procedure(s): ARTHROSCOPY KNEE (Right) KNEE ARTHROSCOPY WITH PARTIAL MEDIAL MENISECTOMY (Right)  SURGEON:  Surgeon(s) and Role:    * Darreld McleanWayne Lamara Brecht, MD - Primary  PHYSICIAN ASSISTANT:   ASSISTANTS: none   ANESTHESIA:   general  EBL:  Total I/O In: 600 [I.V.:600] Out: -   BLOOD ADMINISTERED:none  DRAINS: none   LOCAL MEDICATIONS USED:  MARCAINE     SPECIMEN:  Source of Specimen:  right knee medial meniscus shavings  DISPOSITION OF SPECIMEN:  PATHOLOGY  COUNTS:  YES  TOURNIQUET:   Total Tourniquet Time Documented: Thigh (Right) - 16 minutes Total: Thigh (Right) - 16 minutes   DICTATION: .Other Dictation: Dictation Number [id number]  PLAN OF CARE: Discharge to home after PACU  PATIENT DISPOSITION:  PACU - hemodynamically stable.   Delay start of Pharmacological VTE agent (>24hrs) due to surgical blood loss or risk of bleeding: not applicable

## 2015-01-25 NOTE — Transfer of Care (Signed)
Immediate Anesthesia Transfer of Care Note  Patient: Courtney Patterson  Procedure(s) Performed: Procedure(s): ARTHROSCOPY KNEE (Right) KNEE ARTHROSCOPY WITH PARTIAL MEDIAL MENISECTOMY (Right)  Patient Location: PACU  Anesthesia Type:General  Level of Consciousness: awake and patient cooperative  Airway & Oxygen Therapy: Patient Spontanous Breathing and Patient connected to face mask oxygen  Post-op Assessment: Report given to RN, Post -op Vital signs reviewed and stable and Patient moving all extremities  Post vital signs: Reviewed and stable  Last Vitals:  Filed Vitals:   01/25/15 0735  BP: 100/66  Pulse:   Temp:   Resp: 20    Complications: No apparent anesthesia complications

## 2015-01-25 NOTE — Progress Notes (Signed)
Left a message for Dr. Hilda LiasKeeling with his office staff for him to call patient about her pain medications at home, she says she has enough for several days but worried about running out for the holidays.

## 2015-01-25 NOTE — Anesthesia Procedure Notes (Signed)
Procedure Name: LMA Insertion Date/Time: 01/25/2015 7:45 AM Performed by: Despina HiddenIDACAVAGE, Izaias Krupka J Pre-anesthesia Checklist: Patient identified, Emergency Drugs available, Suction available and Patient being monitored Patient Re-evaluated:Patient Re-evaluated prior to inductionOxygen Delivery Method: Circle system utilized Preoxygenation: Pre-oxygenation with 100% oxygen Intubation Type: IV induction Ventilation: Mask ventilation without difficulty LMA: LMA inserted LMA Size: 4.0 Grade View: Grade I Number of attempts: 1 Placement Confirmation: positive ETCO2 and breath sounds checked- equal and bilateral Tube secured with: Tape Dental Injury: Teeth and Oropharynx as per pre-operative assessment

## 2015-01-25 NOTE — Anesthesia Postprocedure Evaluation (Signed)
  Anesthesia Post-op Note  Patient: Thelma BargeCindy Polak  Procedure(s) Performed: Procedure(s): ARTHROSCOPY KNEE (Right) KNEE ARTHROSCOPY WITH PARTIAL MEDIAL MENISECTOMY (Right)  Patient Location: PACU  Anesthesia Type:General  Level of Consciousness: awake, alert , oriented and patient cooperative  Airway and Oxygen Therapy: Patient Spontanous Breathing  Post-op Pain: 4 /10, moderate  Post-op Assessment: Post-op Vital signs reviewed, Patient's Cardiovascular Status Stable, Respiratory Function Stable, Patent Airway and No signs of Nausea or vomiting              Post-op Vital Signs: Reviewed and stable  Last Vitals:  Filed Vitals:   01/25/15 0735  BP: 100/66  Pulse:   Temp:   Resp: 20    Complications: No apparent anesthesia complications

## 2015-01-25 NOTE — Progress Notes (Signed)
The History and Physical is unchanged. I have examined the patient. The patient is medically able to have surgery on the Right Knee . Darreld McleanKEELING,Hibo Blasdell

## 2015-01-25 NOTE — Anesthesia Preprocedure Evaluation (Signed)
Anesthesia Evaluation  Patient identified by MRN, date of birth, ID band Patient awake    Reviewed: Allergy & Precautions, NPO status , Patient's Chart, lab work & pertinent test results  Airway Mallampati: I  TM Distance: >3 FB     Dental  (+) Teeth Intact, Partial Upper   Pulmonary Current Smoker,    breath sounds clear to auscultation       Cardiovascular negative cardio ROS   Rhythm:Regular Rate:Normal     Neuro/Psych    GI/Hepatic (+)     substance abuse  marijuana use,   Endo/Other    Renal/GU      Musculoskeletal   Abdominal   Peds  Hematology   Anesthesia Other Findings   Reproductive/Obstetrics                             Anesthesia Physical Anesthesia Plan  ASA: I  Anesthesia Plan: General   Post-op Pain Management:    Induction: Intravenous  Airway Management Planned: LMA  Additional Equipment:   Intra-op Plan:   Post-operative Plan: Extubation in OR  Informed Consent: I have reviewed the patients History and Physical, chart, labs and discussed the procedure including the risks, benefits and alternatives for the proposed anesthesia with the patient or authorized representative who has indicated his/her understanding and acceptance.     Plan Discussed with:   Anesthesia Plan Comments:         Anesthesia Quick Evaluation

## 2015-01-25 NOTE — Discharge Instructions (Signed)
Use crutches as needed.  You may remove dressing from the right knee as you desire.  You may cleanse the knee with peroxide, otherwise keep the knee dry.  You may apply Band-aids over the stitches of the right knee as desired.  Keep physical therapy appointments.  See Dr. Hilda LiasKeeling in about ten days.  If any problem, call his office at [phone] or if after hours, the hospital at [phone].  Knee Arthroscopy, Care After Refer to this sheet in the next few weeks. These instructions provide you with information about caring for yourself after your procedure. Your health care provider may also give you more specific instructions. Your treatment has been planned according to current medical practices, but problems sometimes occur. Call your health care provider if you have any problems or questions after your procedure. WHAT TO EXPECT AFTER THE PROCEDURE After your procedure, it is common to have:  Soreness.  Pain. HOME CARE INSTRUCTIONS Bathing  Do not take baths, swim, or use a hot tub until your health care provider approves. Incision Care  There are many different ways to close and cover an incision, including stitches, skin glue, and adhesive strips. Follow your health care provider's instructions about:  Incision care.  Bandage (dressing) changes and removal.  Incision closure removal.  Check your incision area every day for signs of infection. Watch for:  Redness, swelling, or pain.  Fluid, blood, or pus. Activity  Avoid strenuous activities for as long as directed by your health care provider.  Return to your normal activities as directed by your health care provider. Ask your health care provider what activities are safe for you.  Perform range-of-motion exercises only as directed by your health care provider.  Do not lift anything that is heavier than 10 lb (4.5 kg).  Do not drive or operate heavy machinery while taking pain medicine.  If you were given  crutches, use them as directed by your health care provider. Managing Pain, Stiffness, and Swelling  If directed, apply ice to the injured area:  Put ice in a plastic bag.  Place a towel between your skin and the bag.  Leave the ice on for 20 minutes, 2-3 times per day.  Raise the injured area above the level of your heart while you are sitting or lying down as directed by your health care provider. General Instructions  Keep all follow-up visits as directed by your health care provider. This is important.  Take medicines only as directed by your health care provider.  Do not use any tobacco products, including cigarettes, chewing tobacco, or electronic cigarettes. If you need help quitting, ask your health care provider.  If you were given compression stockings, wear them as directed by your health care provider. These stockings help prevent blood clots and reduce swelling in your legs. SEEK MEDICAL CARE IF:  You have severe pain with any movement of your knee.  You notice a bad smell coming from the incision or dressing.  You have redness, swelling, or pain at the site of your incision.  You have fluid, blood, or pus coming from your incision. SEEK IMMEDIATE MEDICAL CARE IF:  You develop a rash.  You have a fever.  You have difficulty breathing or have shortness of breath.  You develop pain in your calves or in the back of your knee.  You develop chest pain.  You develop numbness or tingling in your leg or foot.   This information is not intended to replace advice given  to you by your health care provider. Make sure you discuss any questions you have with your health care provider.   Document Released: 09/13/2004 Document Revised: 07/11/2014 Document Reviewed: 02/20/2014 Elsevier Interactive Patient Education [employer]

## 2015-01-25 NOTE — Op Note (Signed)
Courtney Patterson, Courtney Patterson               ACCOUNT NO.:  [id number]  MEDICAL RECORD NO.:  [id number]  LOCATION:  [facility]                          FACILITY:  [facility]  PHYSICIAN:  J. Darreld Mclean, M.D. DATE OF BIRTH:  [birth year]  DATE OF PROCEDURE: DATE OF DISCHARGE:                              OPERATIVE REPORT   PREOPERATIVE DIAGNOSIS:  Tear of medial meniscus, right knee.  POSTOPERATIVE DIAGNOSIS:  Tear of medial meniscus, right knee.  PROCEDURE:  Operative arthroscopy, partial medial meniscectomy.  ANESTHESIA:  Spinal.  TOURNIQUET TIME:  60 minutes.  SURGEON:  J. Darreld Mclean, M.D.  DRAINS:  No drains.  INDICATIONS:  The patient is a 34 year old female, who has been having pain on and off for the right knee for 2 years, got progressively worse just in last few months.  She has giving way and swelling, popping.  MRI done showed a tear of the medial meniscus of the right knee. Arthroscopy was recommended and went over the risks and imponderables of the procedure.  Preoperatively, the patient appeared to understand.  She asked appropriate questions.  She agreed to have elective surgery today per her request.  DESCRIPTION OF PROCEDURE:  The patient was seen in the holding area. The right knee was identified and marked placed on the right knee.  She was brought to the operating room, placed supine on the operating room table.  General anesthesia was given.  Tourniquet and leg holder placed in a right upper thigh.  The patient was prepped and draped in usual manner.  A generalized time-out identifying the patient as [name] doing a right knee for arthroscopy.  All instrumentation was properly positioned and working.  OR team knew each other.  Leg was wrapped circumferentially with an Esmarch bandage and tourniquet inflated to 250 mmHg.  Esmarch bandage removed.  Inflow cannula placed medially and lactated Ringer's instilled into the knee by an infusion pump.  Arthroscope was  inserted laterally.  The knee was systematically examined.  Permanent pictures were taken.  Suprapatellar pouch looked good on the surface of the knee cap.  The patella looked good.  The groove looked good.  Medially, there was a tear in the posterior portion of the medial meniscus, complex stellate type tear.  The knee looked very good.  Grade 2 changes.  Anterior cruciate was intact.  Lateral side looked excellent with no tear.  Permanent pictures were taken as stated.  Attention was directed back to the medial side and using a meniscal punch and meniscal shaver, the tear was removed and a good smooth contour was obtained.  Knee was systematically examined.  No new pathology.  No fragments were found.  Knee was irrigated with main part of lactated Ringer's.  Wound was reapproximated using 3-0 nylon interrupted vertical mattress manner.  Marcaine 0.25% instilled into each portal.  Tourniquet deflated after 16 minutes.  Sterile dressing applied.  Bulky dressing applied.  The patient tolerated procedure well and will go to recovery in good condition.  She was given appropriate medicine for pain.  I will see in the office in approximately 10 days. If any difficulties contact me through the office of the office hospital beeper system.  ______________________________ Shela CommonsJ. Darreld McleanWayne Jermie Hippe, M.D.     JWK/MEDQ  D:  01/25/2015  T:  01/25/2015  Job:  [id number]

## 2015-01-26 ENCOUNTER — Encounter (HOSPITAL_COMMUNITY): Payer: Self-pay | Admitting: Orthopaedic Surgery

## 2015-04-18 ENCOUNTER — Encounter: Payer: Self-pay | Admitting: Orthopaedic Surgery

## 2015-04-18 ENCOUNTER — Ambulatory Visit: Payer: Medicare Other | Admitting: Orthopaedic Surgery

## 2015-04-25 ENCOUNTER — Ambulatory Visit (INDEPENDENT_AMBULATORY_CARE_PROVIDER_SITE_OTHER): Payer: Self-pay | Admitting: Orthopaedic Surgery

## 2015-04-25 ENCOUNTER — Encounter: Payer: Self-pay | Admitting: Orthopaedic Surgery

## 2015-04-25 VITALS — BP 103/69 | HR 80 | Temp 97.3°F | Resp 16 | Ht 75.0 in | Wt 152.0 lb

## 2015-04-25 DIAGNOSIS — M25561 Pain in right knee: Secondary | ICD-10-CM

## 2015-04-25 MED ORDER — OXYCODONE-ACETAMINOPHEN 5-325 MG PO TABS: 1.0000 | ORAL_TABLET | ORAL | Status: DC | PRN

## 2015-04-25 NOTE — Progress Notes (Signed)
CC:  Right knee pain   She is doing well post arthroscopy of the right knee.  She has some pain with the cooler weather.  She has been under stress and she does not feel well at all some days.  She has no new trauma.  The right knee has no effusion today and full ROM with normal gait.  NV is intact.  I will see her back in six weeks.  I have refilled her pain medicine.  Continue her exercises.  Try to de-stress.

## 2015-04-25 NOTE — Patient Instructions (Addendum)
Smoking Cessation, Tips for Success If you are ready to quit smoking, congratulations! You have chosen to help yourself be healthier. Cigarettes bring nicotine, tar, carbon monoxide, and other irritants into your body. Your lungs, heart, and blood vessels will be able to work better without these poisons. There are many different ways to quit smoking. Nicotine gum, nicotine patches, a nicotine inhaler, or nicotine nasal spray can help with physical craving. Hypnosis, support groups, and medicines help break the habit of smoking. WHAT THINGS CAN I DO TO MAKE QUITTING EASIER?  Here are some tips to help you quit for good:  Pick a date when you will quit smoking completely. Tell all of your friends and family about your plan to quit on that date.  Do not try to slowly cut down on the number of cigarettes you are smoking. Pick a quit date and quit smoking completely starting on that day.  Throw away all cigarettes.   Clean and remove all ashtrays from your home, work, and car.  On a card, write down your reasons for quitting. Carry the card with you and read it when you get the urge to smoke.  Cleanse your body of nicotine. Drink enough water and fluids to keep your urine clear or pale yellow. Do this after quitting to flush the nicotine from your body.  Learn to predict your moods. Do not let a bad situation be your excuse to have a cigarette. Some situations in your life might tempt you into wanting a cigarette.  Never have "just one" cigarette. It leads to wanting another and another. Remind yourself of your decision to quit.  Change habits associated with smoking. If you smoked while driving or when feeling stressed, try other activities to replace smoking. Stand up when drinking your coffee. Brush your teeth after eating. Sit in a different chair when you read the paper. Avoid alcohol while trying to quit, and try to drink fewer caffeinated beverages. Alcohol and caffeine may urge you to  smoke.  Avoid foods and drinks that can trigger a desire to smoke, such as sugary or spicy foods and alcohol.  Ask people who smoke not to smoke around you.  Have something planned to do right after eating or having a cup of coffee. For example, plan to take a walk or exercise.  Try a relaxation exercise to calm you down and decrease your stress. Remember, you may be tense and nervous for the first 2 weeks after you quit, but this will pass.  Find new activities to keep your hands busy. Play with a pen, coin, or rubber band. Doodle or draw things on paper.  Brush your teeth right after eating. This will help cut down on the craving for the taste of tobacco after meals. You can also try mouthwash.   Use oral substitutes in place of cigarettes. Try using lemon drops, carrots, cinnamon sticks, or chewing gum. Keep them handy so they are available when you have the urge to smoke.  When you have the urge to smoke, try deep breathing.  Designate your home as a nonsmoking area.  If you are a heavy smoker, ask your health care provider about a prescription for nicotine chewing gum. It can ease your withdrawal from nicotine.  Reward yourself. Set aside the cigarette money you save and buy yourself something nice.  Look for support from others. Join a support group or smoking cessation program. Ask someone at home or at work to help you with your plan   to quit smoking.  Always ask yourself, "Do I need this cigarette or is this just a reflex?" Tell yourself, "Today, I choose not to smoke," or "I do not want to smoke." You are reminding yourself of your decision to quit.  Do not replace cigarette smoking with electronic cigarettes (commonly called e-cigarettes). The safety of e-cigarettes is unknown, and some may contain harmful chemicals.  If you relapse, do not give up! Plan ahead and think about what you will do the next time you get the urge to smoke. HOW WILL I FEEL WHEN I QUIT SMOKING? You  may have symptoms of withdrawal because your body is used to nicotine (the addictive substance in cigarettes). You may crave cigarettes, be irritable, feel very hungry, cough often, get headaches, or have difficulty concentrating. The withdrawal symptoms are only temporary. They are strongest when you first quit but will go away within 10-14 days. When withdrawal symptoms occur, stay in control. Think about your reasons for quitting. Remind yourself that these are signs that your body is healing and getting used to being without cigarettes. Remember that withdrawal symptoms are easier to treat than the major diseases that smoking can cause.  Even after the withdrawal is over, expect periodic urges to smoke. However, these cravings are generally short lived and will go away whether you smoke or not. Do not smoke! WHAT RESOURCES ARE AVAILABLE TO HELP ME QUIT SMOKING? Your health care provider can direct you to community resources or hospitals for support, which may include:  Group support.  Education.  Hypnosis.  Therapy.   This information is not intended to replace advice given to you by your health care provider. Make sure you discuss any questions you have with your health care provider.   Document Released: 11/23/2003 Document Revised: 03/17/2014 Document Reviewed: 08/12/2012 Elsevier Interactive Patient Education [employer] Call if any problem.

## 2015-05-25 ENCOUNTER — Telehealth: Payer: Self-pay | Admitting: Orthopaedic Surgery

## 2015-05-25 NOTE — Telephone Encounter (Signed)
Patient requesting refill of Oxycodone 5/325mg   Qty 120 Tablets  1 every 4 hours as need for moderate to severe pain. (Must last 30 days do not drive or operate machinery while taking this medication).

## 2015-05-28 ENCOUNTER — Other Ambulatory Visit: Payer: Self-pay | Admitting: *Deleted

## 2015-05-28 MED ORDER — OXYCODONE-ACETAMINOPHEN 5-325 MG PO TABS: 1.0000 | ORAL_TABLET | ORAL | Status: DC | PRN

## 2015-05-28 NOTE — Telephone Encounter (Signed)
Prescription available 

## 2015-06-06 ENCOUNTER — Ambulatory Visit (INDEPENDENT_AMBULATORY_CARE_PROVIDER_SITE_OTHER): Payer: Medicare Other | Admitting: Orthopaedic Surgery

## 2015-06-06 ENCOUNTER — Encounter: Payer: Self-pay | Admitting: Orthopaedic Surgery

## 2015-06-06 VITALS — BP 113/79 | HR 91 | Temp 98.1°F | Ht 75.0 in | Wt 150.4 lb

## 2015-06-06 DIAGNOSIS — M25561 Pain in right knee: Secondary | ICD-10-CM | POA: Diagnosis not present

## 2015-06-06 MED ORDER — DICLOFENAC SODIUM 75 MG PO TBEC: 75.0000 mg | DELAYED_RELEASE_TABLET | Freq: Two times a day (BID) | ORAL | Status: DC

## 2015-06-06 NOTE — Progress Notes (Signed)
Patient ON:GEXBM:Courtney Patterson, female DOB:09/12/1980, 35 y.o. WUX:[id number]  Chief Complaint  Patient presents with  . Knee Pain    right, medication managment, not working    HPI  Courtney Patterson is a 35 y.o. female who has right knee pain.  She is post arthroscopy of the right knee on 01/25/16.  She has been doing well until she hit it against a wood stove this past week.  She has some slight swelling and pain now.  The Naprosyn is not working she says. I told her to stop this and begin diclofenac.  I will call in Rx.  She has no giving way, no locking.   Knee Pain  The incident occurred more than 1 week ago. The incident occurred at home. The pain is present in the right knee. The quality of the pain is described as aching. The pain is at a severity of 3/10. The pain is mild. The pain has been fluctuating since onset. Pertinent negatives include no numbness or tingling. The symptoms are aggravated by weight bearing. She has tried ice, elevation, heat and NSAIDs for the symptoms. The treatment provided moderate relief.    Body mass index is 18.8 kg/(m^2).   Review of Systems  Constitutional:       Patient does not have Diabetes Mellitus. Patient does not have hypertension. Patient does not have COPD or shortness of breath. Patient does not have BMI > 35. Patient has current smoking history  HENT: Negative for congestion.   Respiratory: Negative for cough and shortness of breath.   Cardiovascular: Negative for chest pain and leg swelling.  Endocrine: Negative for cold intolerance.  Musculoskeletal: Positive for joint swelling, arthralgias and gait problem.  Allergic/Immunologic: Negative for environmental allergies.  Neurological: Negative for tingling and numbness.  All other systems reviewed and are negative.   Past Medical History  Diagnosis Date  . Knee pain, chronic     Past Surgical History  Procedure Laterality Date  . Tubal ligation    . Dental restoration/extraction with  x-ray    . Knee arthroscopy Right 01/25/2015    Procedure: ARTHROSCOPY KNEE;  Surgeon: Darreld McleanWayne Dianah Pruett, MD;  Location: [facility];  Service: Orthopedics;  Laterality: Right;  . Knee arthroscopy with medial menisectomy Right 01/25/2015    Procedure: KNEE ARTHROSCOPY WITH PARTIAL MEDIAL MENISECTOMY;  Surgeon: Darreld McleanWayne Zakai Gonyea, MD;  Location: [facility];  Service: Orthopedics;  Laterality: Right;    No family history on file.  Social History Social History  Substance Use Topics  . Smoking status: Current Every Day Smoker -- 1.00 packs/day for 18 years  . Smokeless tobacco: Never Used  . Alcohol Use: No     Comment: remote marjuana    No Known Allergies  Current Outpatient Prescriptions  Medication Sig Dispense Refill  . Doxepin HCl 5 % CREA     . oxyCODONE-acetaminophen (PERCOCET/ROXICET) 5-325 MG tablet Take 1 tablet by mouth every 4 (four) hours as needed for moderate pain or severe pain (Must last 30 days.  Do not drive or operate machinery while taking this medicine). 120 tablet 0  . PARoxetine (PAXIL) 20 MG tablet Take 1 tablet by mouth daily.    . diclofenac (VOLTAREN) 75 MG EC tablet Take 1 tablet (75 mg total) by mouth 2 (two) times daily with a meal. 60 tablet 2   No current facility-administered medications for this visit.     Physical Exam  Blood pressure 113/79, pulse 91, temperature 98.1 F (36.7 C), temperature source Tympanic, height 6'  3" (1.905 m), weight 150 lb 6.4 oz (68.221 kg), last menstrual period 05/23/2015.  Constitutional: overall normal hygiene, normal nutrition, well developed, normal grooming, normal body habitus. Assistive device:none  Musculoskeletal: gait and station Limp right, muscle tone and strength are normal, no tremors or atrophy is present.  .  Neurological: coordination overall normal.  Deep tendon reflex/nerve stretch intact.  Sensation normal.  Cranial nerves II-XII intact.   Skin:   normal overall no scars, lesions, ulcers or rashes. No  psoriasis.  Psychiatric: Alert and oriented x 3.  Recent memory intact, remote memory unclear.  Normal mood and affect. Well groomed.  Good eye contact.  Cardiovascular: overall no swelling, no varicosities, no edema bilaterally, normal temperatures of the legs and arms, no clubbing, cyanosis and good capillary refill.  Lymphatic: palpation is normal.  The right lower extremity is examined:  Inspection:  Thigh:  Non-tender and no defects  Knee has swelling 1+ effusion.                        Joint tenderness is present                        Patient is not tender over the medial joint line  Lower Leg:  Has normal appearance and no tenderness or defects  Ankle:  Non-tender and no defects  Foot:  Non-tender and no defects Range of Motion:  Knee:  Range of motion is: 0-115                        Crepitus is  present  Ankle:  Range of motion is normal. Strength and Tone:  The right lower extremity has normal strength and tone. Stability:  Knee:  The knee is stable.  Ankle:  The ankle is stable.   The patient has been educated about the nature of the problem(s) and counseled on treatment options.  The patient appeared to understand what I have discussed and is in agreement with it.  Encounter Diagnosis  Name Primary?  . Right knee pain Yes    PLAN Call if any problems.  Precautions discussed.  Continue current medications.   Return to clinic 6 weeks

## 2015-06-27 ENCOUNTER — Telehealth: Payer: Self-pay | Admitting: Orthopaedic Surgery

## 2015-06-27 MED ORDER — HYDROCODONE-ACETAMINOPHEN 7.5-325 MG PO TABS: 1.0000 | ORAL_TABLET | ORAL | Status: DC | PRN

## 2015-06-27 NOTE — Telephone Encounter (Signed)
Rx done. 

## 2015-06-27 NOTE — Telephone Encounter (Signed)
Call received from patient for refill of medication: oxyCODONE-acetaminophen (PERCOCET/ROXICET) 5-325 MG tablet [[id number]][[id number]] - quantity 120

## 2015-07-18 ENCOUNTER — Ambulatory Visit: Payer: Medicare Other | Admitting: Orthopaedic Surgery

## 2015-07-26 ENCOUNTER — Encounter: Payer: Self-pay | Admitting: Orthopaedic Surgery

## 2015-07-26 ENCOUNTER — Ambulatory Visit (INDEPENDENT_AMBULATORY_CARE_PROVIDER_SITE_OTHER): Payer: Medicare Other | Admitting: Orthopaedic Surgery

## 2015-07-26 VITALS — BP 115/72 | HR 72 | Ht 75.0 in | Wt 147.0 lb

## 2015-07-26 DIAGNOSIS — M25561 Pain in right knee: Secondary | ICD-10-CM

## 2015-07-26 MED ORDER — HYDROCODONE-ACETAMINOPHEN 7.5-325 MG PO TABS: 1.0000 | ORAL_TABLET | ORAL | Status: DC | PRN

## 2015-07-26 NOTE — Progress Notes (Signed)
Patient ZO:XWRUE:Courtney Patterson, female DOB:07/28/1980, 35 y.o. AVW:[id number]  Chief Complaint  Patient presents with  . Follow-up    RIGHT KNEE PAIN    HPI  Courtney Patterson is a 35 y.o. female who has chronic right knee pain.  She is post arthroscopy of the right knee.  She has pain with increased activity and cold weather.  She has no giving way or locking.  She is active.  HPI  Body mass index is 18.37 kg/(m^2).  ROS  Review of Systems  Constitutional:       Patient does not have Diabetes Mellitus. Patient does not have hypertension. Patient does not have COPD or shortness of breath. Patient does not have BMI > 35. Patient has current smoking history  HENT: Negative for congestion.   Respiratory: Negative for cough and shortness of breath.   Cardiovascular: Negative for chest pain and leg swelling.  Endocrine: Negative for cold intolerance.  Musculoskeletal: Positive for joint swelling, arthralgias and gait problem.  Allergic/Immunologic: Negative for environmental allergies.  Neurological: Negative for numbness.  All other systems reviewed and are negative.   Past Medical History  Diagnosis Date  . Knee pain, chronic     Past Surgical History  Procedure Laterality Date  . Tubal ligation    . Dental restoration/extraction with x-ray    . Knee arthroscopy Right 01/25/2015    Procedure: ARTHROSCOPY KNEE;  Surgeon: Darreld McleanWayne Modena Bellemare, MD;  Location: [facility];  Service: Orthopedics;  Laterality: Right;  . Knee arthroscopy with medial menisectomy Right 01/25/2015    Procedure: KNEE ARTHROSCOPY WITH PARTIAL MEDIAL MENISECTOMY;  Surgeon: Darreld McleanWayne Brittay Mogle, MD;  Location: [facility];  Service: Orthopedics;  Laterality: Right;    History reviewed. No pertinent family history.  Social History Social History  Substance Use Topics  . Smoking status: Current Every Day Smoker -- 1.00 packs/day for 18 years  . Smokeless tobacco: Never Used  . Alcohol Use: No     Comment: remote marjuana    No  Known Allergies  Current Outpatient Prescriptions  Medication Sig Dispense Refill  . citalopram (CELEXA) 20 MG tablet Take 20 mg by mouth daily.    Marland Kitchen. HYDROcodone-acetaminophen (NORCO) 7.5-325 MG tablet Take 1 tablet by mouth every 4 (four) hours as needed for moderate pain (Must last 30 days.  Do not drive or operate machinery while taking this medicine.). 120 tablet 0   No current facility-administered medications for this visit.     Physical Exam  Blood pressure 115/72, pulse 72, height 6\' 3"  (1.905 m), weight 147 lb (66.679 kg).  Constitutional: overall normal hygiene, normal nutrition, well developed, normal grooming, normal body habitus. Assistive device:none  Musculoskeletal: gait and station Limp right just slightly, muscle tone and strength are normal, no tremors or atrophy is present.  .  Neurological: coordination overall normal.  Deep tendon reflex/nerve stretch intact.  Sensation normal.  Cranial nerves II-XII intact.   Skin:   Scars right knee, otherwise overall no scars, lesions, ulcers or rashes. No psoriasis.  Psychiatric: Alert and oriented x 3.  Recent memory intact, remote memory unclear.  Normal mood and affect. Well groomed.  Good eye contact.  Cardiovascular: overall no swelling, no varicosities, no edema bilaterally, normal temperatures of the legs and arms, no clubbing, cyanosis and good capillary refill.  Lymphatic: palpation is normal.  The right lower extremity is examined:  Inspection:  Thigh:  Non-tender and no defects  Knee does not have swelling 0 effusion.  Joint tenderness is present                        Patient is tender over the medial joint line  Lower Leg:  Has normal appearance and no tenderness or defects  Ankle:  Non-tender and no defects  Foot:  Non-tender and no defects Range of Motion:  Knee:  Range of motion is: 0-115                        Crepitus is  present  Ankle:  Range of motion is normal. Strength  and Tone:  The right lower extremity has normal strength and tone. Stability:  Knee:  The knee is stable.  Ankle:  The ankle is stable.   Left knee negative.  The patient has been educated about the nature of the problem(s) and counseled on treatment options.  The patient appeared to understand what I have discussed and is in agreement with it.  Encounter Diagnosis  Name Primary?  . Right knee pain Yes    PLAN Call if any problems.  Precautions discussed.  Continue current medications.   Return to clinic 3 months

## 2015-08-27 ENCOUNTER — Telehealth: Payer: Self-pay | Admitting: Orthopaedic Surgery

## 2015-08-27 MED ORDER — HYDROCODONE-ACETAMINOPHEN 5-325 MG PO TABS: 1.0000 | ORAL_TABLET | ORAL | Status: DC | PRN

## 2015-08-27 NOTE — Telephone Encounter (Signed)
Rx done. 

## 2015-08-27 NOTE — Telephone Encounter (Signed)
Hydrocodone-Acetaminophen 7.5/325mg Qty 120 Tablets °

## 2015-09-24 ENCOUNTER — Telehealth: Payer: Self-pay | Admitting: Orthopaedic Surgery

## 2015-09-24 NOTE — Telephone Encounter (Signed)
Hydrocodone-Acetaminophen  5/325mg  Qty 120 Tablets °

## 2015-09-25 MED ORDER — HYDROCODONE-ACETAMINOPHEN 5-325 MG PO TABS: 1.0000 | ORAL_TABLET | ORAL | Status: DC | PRN

## 2015-09-25 NOTE — Telephone Encounter (Signed)
Rx done. 

## 2015-10-25 ENCOUNTER — Ambulatory Visit: Payer: Medicare Other | Admitting: Orthopaedic Surgery

## 2016-02-20 ENCOUNTER — Encounter (HOSPITAL_COMMUNITY): Payer: Self-pay | Admitting: Emergency Medicine

## 2016-02-20 ENCOUNTER — Emergency Department (HOSPITAL_COMMUNITY)
Admission: EM | Admit: 2016-02-20 | Discharge: 2016-02-20 | Disposition: A | Discharge status: Home / Self Care | Payer: Medicare Other | Attending: Emergency Medicine | Admitting: Emergency Medicine

## 2016-02-20 ENCOUNTER — Emergency Department (HOSPITAL_COMMUNITY): Payer: Medicare Other

## 2016-02-20 DIAGNOSIS — S3992XA Unspecified injury of lower back, initial encounter: Secondary | ICD-10-CM | POA: Diagnosis present

## 2016-02-20 DIAGNOSIS — S39012A Strain of muscle, fascia and tendon of lower back, initial encounter: Secondary | ICD-10-CM | POA: Insufficient documentation

## 2016-02-20 DIAGNOSIS — Y939 Activity, unspecified: Secondary | ICD-10-CM | POA: Insufficient documentation

## 2016-02-20 DIAGNOSIS — F17200 Nicotine dependence, unspecified, uncomplicated: Secondary | ICD-10-CM | POA: Insufficient documentation

## 2016-02-20 DIAGNOSIS — Y999 Unspecified external cause status: Secondary | ICD-10-CM | POA: Diagnosis not present

## 2016-02-20 DIAGNOSIS — Y929 Unspecified place or not applicable: Secondary | ICD-10-CM | POA: Insufficient documentation

## 2016-02-20 DIAGNOSIS — W182XXA Fall in (into) shower or empty bathtub, initial encounter: Secondary | ICD-10-CM | POA: Diagnosis not present

## 2016-02-20 MED ORDER — HYDROCODONE-ACETAMINOPHEN 5-325 MG PO TABS
1.0000 | ORAL_TABLET | Freq: Once | ORAL | Status: AC
  Administered 2016-02-20: 1 via ORAL
  Filled 2016-02-20: qty 1

## 2016-02-20 MED ORDER — HYDROCODONE-ACETAMINOPHEN 5-325 MG PO TABS: 1.0000 | ORAL_TABLET | ORAL | 0 refills | Status: DC | PRN

## 2016-02-20 MED ORDER — CYCLOBENZAPRINE HCL 5 MG PO TABS: 5.0000 mg | ORAL_TABLET | Freq: Three times a day (TID) | ORAL | 0 refills | Status: DC | PRN

## 2016-02-20 MED ORDER — NAPROXEN 500 MG PO TABS: 500.0000 mg | ORAL_TABLET | Freq: Two times a day (BID) | ORAL | 0 refills | Status: DC

## 2016-02-20 NOTE — ED Triage Notes (Signed)
Larey SeatFell out shower x 1 month ago. Seen pcp and had xrays with neg results. Has beenhurting to r lower back radiating up since. Has tried aleve with no relif.

## 2016-02-20 NOTE — ED Notes (Signed)
Patient transported to X-ray 

## 2016-02-20 NOTE — ED Notes (Signed)
Pt verbalized understanding of no driving and to use caution within 4 hours of taking pain meds due to meds cause drowsiness 

## 2016-02-20 NOTE — Discharge Instructions (Signed)
Do not drive within 4 hours of taking hydrocodone as this will make you drowsy.  Avoid lifting,  Bending,  Twisting or any other activity that worsens your pain over the next week.  Apply a heating pad to your lower back for 20 minutes several times daily..  You should get rechecked if your symptoms are not better over the next 5 days,  Or you develop increased pain,  Weakness in your leg(s) or loss of bladder or bowel function - these are symptoms of a worse injury. ° °

## 2016-02-20 NOTE — ED Notes (Signed)
Pt returned from xray

## 2016-02-21 NOTE — ED Provider Notes (Signed)
[facility] Provider Note   CSN: [id number] Arrival date & time: 02/20/16  1632     History   Chief Complaint Chief Complaint  Patient presents with  . Back Pain    HPI Courtney Patterson is a 35 y.o. female presenting with persistent low back pain since slipping and falling in her shower one month ago. She describes landing flat on her back and was seen by her pcp and prescribed a prednisone taper which helped briefly but now endorses escalating pain across her entire lower back. There is no radiation of pain into her legs and denies weakness or numbness and has had no urinary or bowel retention or incontinence.  Patient does not have a history of cancer or IVDU.  The patient has tried tylenol and rest without significant relief of symptoms. .  The history is provided by the patient.    Past Medical History:  Diagnosis Date  . Knee pain, chronic     There are no active problems to display for this patient.   Past Surgical History:  Procedure Laterality Date  . ABDOMINAL HYSTERECTOMY    . DENTAL RESTORATION/EXTRACTION WITH X-RAY    . KNEE ARTHROSCOPY Right 01/25/2015   Procedure: ARTHROSCOPY KNEE;  Surgeon: Darreld McleanWayne Keeling, MD;  Location: [facility];  Service: Orthopedics;  Laterality: Right;  . KNEE ARTHROSCOPY WITH MEDIAL MENISECTOMY Right 01/25/2015   Procedure: KNEE ARTHROSCOPY WITH PARTIAL MEDIAL MENISECTOMY;  Surgeon: Darreld McleanWayne Keeling, MD;  Location: [facility];  Service: Orthopedics;  Laterality: Right;  . TUBAL LIGATION      OB History    No data available       Home Medications    Prior to Admission medications   Medication Sig Start Date End Date Taking? Authorizing Provider  citalopram (CELEXA) 20 MG tablet Take 20 mg by mouth daily.   Yes Historical Provider, MD  cyclobenzaprine (FLEXERIL) 5 MG tablet Take 1 tablet (5 mg total) by mouth 3 (three) times daily as needed for muscle spasms. 02/20/16   Burgess AmorJulie Kalen Ratajczak, PA-C  HYDROcodone-acetaminophen (NORCO/VICODIN)  5-325 MG tablet Take 1 tablet by mouth every 4 (four) hours as needed. 02/20/16   Burgess AmorJulie Audryana Hockenberry, PA-C  naproxen (NAPROSYN) 500 MG tablet Take 1 tablet (500 mg total) by mouth 2 (two) times daily. 02/20/16   Burgess AmorJulie Zacarias Krauter, PA-C    Family History History reviewed. No pertinent family history.  Social History Social History  Substance Use Topics  . Smoking status: Current Every Day Smoker    Packs/day: 1.00    Years: 18.00  . Smokeless tobacco: Never Used  . Alcohol use No     Comment: remote marjuana     Allergies   Patient has no known allergies.   Review of Systems Review of Systems  Constitutional: Negative for fever.  Respiratory: Negative for shortness of breath.   Cardiovascular: Negative for chest pain and leg swelling.  Gastrointestinal: Negative for abdominal distention, abdominal pain and constipation.  Genitourinary: Negative for difficulty urinating, dysuria, flank pain, frequency and urgency.  Musculoskeletal: Positive for back pain. Negative for gait problem and joint swelling.  Skin: Negative for rash.  Neurological: Negative for weakness and numbness.     Physical Exam Updated Vital Signs BP 118/77 (BP Location: Right Arm)   Pulse (!) 52   Temp 97.7 F (36.5 C) (Oral)   Resp 19   Ht 6\' 3"  (1.905 m)   Wt 65.8 kg   SpO2 96%   BMI 18.12 kg/m   Physical Exam  Constitutional: She appears well-developed and well-nourished.  HENT:  Head: Normocephalic.  Eyes: Conjunctivae are normal.  Neck: Normal range of motion. Neck supple.  Cardiovascular: Normal rate and intact distal pulses.   Pedal pulses normal.  Pulmonary/Chest: Effort normal.  Abdominal: Soft. Bowel sounds are normal. She exhibits no distension and no mass.  Musculoskeletal: Normal range of motion. She exhibits no edema.       Lumbar back: She exhibits tenderness. She exhibits no swelling, no edema and no spasm.  Midline lumbar and bilateral paralumber ttp.  No edema or palpable deformity.    Neurological: She is alert. She has normal strength. She displays no atrophy and no tremor. No sensory deficit. Gait normal.  Reflex Scores:      Patellar reflexes are 2+ on the right side and 2+ on the left side.      Achilles reflexes are 2+ on the right side and 2+ on the left side. No strength deficit noted in hip and knee flexor and extensor muscle groups.  Ankle flexion and extension intact.  Skin: Skin is warm and dry.  Psychiatric: She has a normal mood and affect.  Nursing note and vitals reviewed.    ED Treatments / Results  Labs (all labs ordered are listed, but only abnormal results are displayed) Labs Reviewed - No data to display  EKG  EKG Interpretation None       Radiology Dg Lumbar Spine Complete  Result Date: 02/20/2016 CLINICAL DATA:  Slip and fall in shower 1 month ago. Bilateral low back pain, worse when lying on LEFT side. EXAM: LUMBAR SPINE - COMPLETE 4+ VIEW COMPARISON:  None. FINDINGS: Five non rib-bearing lumbar-type vertebral bodies are intact and aligned with maintenance of the lumbar lordosis. Intervertebral disc heights are normal. No destructive bony lesions. Sacroiliac joints are symmetric. Included prevertebral and paraspinal soft tissue planes are non-suspicious. IMPRESSION: Negative. Electronically Signed   By: Awilda Metroourtnay  Bloomer M.D.   On: 02/20/2016 19:00    Procedures Procedures (including critical care time)  Medications Ordered in ED Medications  HYDROcodone-acetaminophen (NORCO/VICODIN) 5-325 MG per tablet 1 tablet (1 tablet Oral Given 02/20/16 1836)     Initial Impression / Assessment and Plan / ED Course  I have reviewed the triage vital signs and the nursing notes.  Pertinent labs & imaging results that were available during my care of the patient were reviewed by me and considered in my medical decision making (see chart for details).  Clinical Course     No neuro deficit on exam or by history to suggest emergent or surgical  presentation.  discussed worsened sx that should prompt immediate re-evaluation including distal weakness, bowel/bladder retention/incontinence.  Plan f/u with pcp for recheck in one week if not improved,  Flexeril, naproxen, hydrocodone, heat tx, activities as tolerated         Final Clinical Impressions(s) / ED Diagnoses   Final diagnoses:  Strain of lumbar region, initial encounter    New Prescriptions Discharge Medication List as of 02/20/2016  7:50 PM    START taking these medications   Details  cyclobenzaprine (FLEXERIL) 5 MG tablet Take 1 tablet (5 mg total) by mouth 3 (three) times daily as needed for muscle spasms., Starting Wed 02/20/2016, Print    naproxen (NAPROSYN) 500 MG tablet Take 1 tablet (500 mg total) by mouth 2 (two) times daily., Starting Wed 02/20/2016, Print         Burgess AmorJulie Anthonny Schiller, PA-C 02/21/16 1232    Bethann BerkshireJoseph Zammit, MD 02/21/16  2318  

## 2017-09-17 ENCOUNTER — Ambulatory Visit (INDEPENDENT_AMBULATORY_CARE_PROVIDER_SITE_OTHER): Payer: Medicare Other | Admitting: Orthopaedic Surgery

## 2017-09-17 ENCOUNTER — Ambulatory Visit (INDEPENDENT_AMBULATORY_CARE_PROVIDER_SITE_OTHER): Payer: Medicare Other

## 2017-09-17 ENCOUNTER — Encounter: Payer: Self-pay | Admitting: Orthopaedic Surgery

## 2017-09-17 VITALS — BP 111/85 | HR 144 | Temp 97.8°F | Ht 74.0 in | Wt 151.0 lb

## 2017-09-17 DIAGNOSIS — M542 Cervicalgia: Secondary | ICD-10-CM | POA: Diagnosis not present

## 2017-09-17 MED ORDER — CYCLOBENZAPRINE HCL 10 MG PO TABS: 10.0000 mg | ORAL_TABLET | Freq: Every day | ORAL | 0 refills | Status: DC

## 2017-09-17 MED ORDER — NAPROXEN 500 MG PO TABS: 500.0000 mg | ORAL_TABLET | Freq: Two times a day (BID) | ORAL | 5 refills | Status: DC

## 2017-09-17 NOTE — Progress Notes (Signed)
Patient Courtney Patterson, female DOB:06/17/1980, 37 y.o. WJX:[id number]  Chief Complaint  Patient presents with  . Neck Pain    HPI  Courtney Patterson is a 37 y.o. female who complains of neck pain. She fell backwards out of her shower about a year ago and hurt her neck.  It has been bothering her on and off since then, worse over the last few weeks. She has stiffness of the neck at times, it pops.  She has no weakness, no numbness.  The pain is localized to the neck.  She has seen another physician for this.  She would like to have me examine her today.  She is taking Tramadol for pain and has been on this for some time.   Body mass index is 19.39 kg/m.  ROS  Review of Systems  Constitutional: Positive for activity change.  Musculoskeletal: Positive for arthralgias, back pain, gait problem, joint swelling and neck pain.  All other systems reviewed and are negative.   All other systems reviewed and are negative.  Past Medical History:  Diagnosis Date  . Knee pain, chronic     Past Surgical History:  Procedure Laterality Date  . ABDOMINAL HYSTERECTOMY    . DENTAL RESTORATION/EXTRACTION WITH X-RAY    . KNEE ARTHROSCOPY Right 01/25/2015   Procedure: ARTHROSCOPY KNEE;  Surgeon: Darreld Mclean, MD;  Location: [facility];  Service: Orthopedics;  Laterality: Right;  . KNEE ARTHROSCOPY WITH MEDIAL MENISECTOMY Right 01/25/2015   Procedure: KNEE ARTHROSCOPY WITH PARTIAL MEDIAL MENISECTOMY;  Surgeon: Darreld Mclean, MD;  Location: [facility];  Service: Orthopedics;  Laterality: Right;  . TUBAL LIGATION      Family History  Problem Relation Age of Onset  . Cancer Mother   . Cancer Father     Social History Social History   Tobacco Use  . Smoking status: Current Every Day Smoker    Packs/day: 1.00    Years: 18.00    Pack years: 18.00  . Smokeless tobacco: Never Used  Substance Use Topics  . Alcohol use: No    Alcohol/week: 0.0 oz    Comment: remote marjuana  . Drug use: Yes   Types: Marijuana    Comment: last use a week and half ago    No Known Allergies  Current Outpatient Medications  Medication Sig Dispense Refill  . omeprazole (PRILOSEC) 20 MG capsule Take 20 mg by mouth 2 (two) times daily.  0  . citalopram (CELEXA) 20 MG tablet Take 20 mg by mouth daily.    . cyclobenzaprine (FLEXERIL) 5 MG tablet Take 1 tablet (5 mg total) by mouth 3 (three) times daily as needed for muscle spasms. (Patient not taking: Reported on 09/17/2017) 15 tablet 0  . sertraline (ZOLOFT) 50 MG tablet Take 50 mg by mouth daily.  2  . traMADol (ULTRAM) 50 MG tablet TAKE TWO TABLETS BY MOUTH EVERY 6 HOURS AS NEEDED  2   No current facility-administered medications for this visit.      Physical Exam  Blood pressure 111/85, pulse (!) 144, temperature 97.8 F (36.6 C), height 6\' 2"  (1.88 m), weight 151 lb (68.5 kg), last menstrual period 05/23/2015.  Constitutional: overall normal hygiene, normal nutrition, well developed, normal grooming, normal body habitus. Assistive device:none  Musculoskeletal: gait and station Limp none, muscle tone and strength are normal, no tremors or atrophy is present.  .  Neurological: coordination overall normal.  Deep tendon reflex/nerve stretch intact.  Sensation normal.  Cranial nerves II-XII intact.   Skin:  Normal overall no scars, lesions, ulcers or rashes. No psoriasis.  Psychiatric: Alert and oriented x 3.  Recent memory intact, remote memory unclear.  Normal mood and affect. Well groomed.  Good eye contact.  Cardiovascular: overall no swelling, no varicosities, no edema bilaterally, normal temperatures of the legs and arms, no clubbing, cyanosis and good capillary refill.  Lymphatic: palpation is normal.  Her neck has full motion but is tender diffusely.  She has no spasm.  Reflexes are normal.  All other systems reviewed and are negative   The patient has been educated about the nature of the problem(s) and counseled on treatment  options.  The patient appeared to understand what I have discussed and is in agreement with it.  Encounter Diagnosis  Name Primary?  . Cervicalgia Yes   X-rays were done of the cervical spine, reported separately.  PLAN Call if any problems.  Precautions discussed.  Begin Flexeril and Naprosyn.  Return to clinic 3 weeks   Electronically Signed Darreld McleanWayne Desmen Schoffstall, MD 7/11/20193:01 PM

## 2017-10-07 ENCOUNTER — Encounter: Payer: Self-pay | Admitting: Orthopaedic Surgery

## 2017-10-07 ENCOUNTER — Ambulatory Visit (INDEPENDENT_AMBULATORY_CARE_PROVIDER_SITE_OTHER): Payer: Medicare Other | Admitting: Orthopaedic Surgery

## 2017-10-07 DIAGNOSIS — M542 Cervicalgia: Secondary | ICD-10-CM | POA: Diagnosis not present

## 2017-10-07 NOTE — Patient Instructions (Signed)

## 2017-10-07 NOTE — Progress Notes (Signed)
[name], female DOB:09/18/1980, 37 y.o. JYN:[id number]  Chief Complaint  Patient presents with  . Neck Pain    HPI  Cato MulliganCindy L Lindblad is a 37 y.o. female who continues to have more and more neck pain.  She has tightness in the neck and it is hard to move at times.  She has no new trauma, no paresthesias. She has a "stiff neck" most of the time now.  She says the Tramadol does not help.  I will get a MRI of the cervical spine.  Body mass index is 20.54 kg/m.  ROS  Review of Systems  Constitutional: Positive for activity change.  Musculoskeletal: Positive for arthralgias, back pain, gait problem, joint swelling and neck pain.  All other systems reviewed and are negative.   All other systems reviewed and are negative.  Past Medical History:  Diagnosis Date  . Knee pain, chronic     Past Surgical History:  Procedure Laterality Date  . ABDOMINAL HYSTERECTOMY    . DENTAL RESTORATION/EXTRACTION WITH X-RAY    . KNEE ARTHROSCOPY Right 01/25/2015   Procedure: ARTHROSCOPY KNEE;  Surgeon: Darreld McleanWayne Janelly Switalski, MD;  Location: [facility];  Service: Orthopedics;  Laterality: Right;  . KNEE ARTHROSCOPY WITH MEDIAL MENISECTOMY Right 01/25/2015   Procedure: KNEE ARTHROSCOPY WITH PARTIAL MEDIAL MENISECTOMY;  Surgeon: Darreld McleanWayne Yehonatan Grandison, MD;  Location: [facility];  Service: Orthopedics;  Laterality: Right;  . TUBAL LIGATION      Family History  Problem Relation Age of Onset  . Cancer Mother   . Cancer Father     Social History Social History   Tobacco Use  . Smoking status: Current Every Day Smoker    Packs/day: 1.00    Years: 18.00    Pack years: 18.00  . Smokeless tobacco: Never Used  Substance Use Topics  . Alcohol use: No    Alcohol/week: 0.0 oz    Comment: remote marjuana  . Drug use: Yes    Types: Marijuana    Comment: last use a week and half ago    No Known Allergies  Current Outpatient Medications  Medication Sig Dispense Refill  . citalopram (CELEXA) 20 MG  tablet Take 20 mg by mouth daily.    . cyclobenzaprine (FLEXERIL) 10 MG tablet Take 1 tablet (10 mg total) by mouth at bedtime. One tablet every night at bedtime as needed for spasm. 30 tablet 0  . naproxen (NAPROSYN) 500 MG tablet Take 1 tablet (500 mg total) by mouth 2 (two) times daily with a meal. 60 tablet 5  . omeprazole (PRILOSEC) 20 MG capsule Take 20 mg by mouth 2 (two) times daily.  0  . sertraline (ZOLOFT) 50 MG tablet Take 50 mg by mouth daily.  2  . traMADol (ULTRAM) 50 MG tablet TAKE TWO TABLETS BY MOUTH EVERY 6 HOURS AS NEEDED  2   No current facility-administered medications for this visit.      Physical Exam  Blood pressure 111/76, pulse 82, height 6\' 2"  (1.88 m), weight 160 lb (72.6 kg), last menstrual period 05/23/2015.  Constitutional: overall normal hygiene, normal nutrition, well developed, normal grooming, normal body habitus. Assistive device:none  Musculoskeletal: gait and station Limp none, muscle tone and strength are normal, no tremors or atrophy is present.  .  Neurological: coordination overall normal.  Deep tendon reflex/nerve stretch intact.  Sensation normal.  Cranial nerves II-XII intact.   Skin:   Normal overall no scars, lesions, ulcers or rashes. No psoriasis.  Psychiatric: Alert and oriented x  3.  Recent memory intact, remote memory unclear.  Normal mood and affect. Well groomed.  Good eye contact.  Cardiovascular: overall no swelling, no varicosities, no edema bilaterally, normal temperatures of the legs and arms, no clubbing, cyanosis and good capillary refill.  Lymphatic: palpation is normal.  Neck is very tender and painful to move.  She has no spasm.  NV intact.  Grips are normal.  All other systems reviewed and are negative   The patient has been educated about the nature of the problem(s) and counseled on treatment options.  The patient appeared to understand what I have discussed and is in agreement with it.  Encounter Diagnosis  Name  Primary?  . Cervicalgia     PLAN Call if any problems.  Precautions discussed.  Continue current medications.   Return to clinic after MRI of the cervical spine.   Electronically Signed Darreld Mclean, MD 7/31/20192:58 PM

## 2017-10-08 ENCOUNTER — Ambulatory Visit: Payer: Medicare Other | Admitting: Orthopaedic Surgery

## 2017-10-20 ENCOUNTER — Ambulatory Visit (HOSPITAL_COMMUNITY)
Admission: RE | Admit: 2017-10-20 | Discharge: 2017-10-20 | Disposition: A | Discharge status: Home / Self Care | Payer: Medicare Other | Source: Ambulatory Visit | Attending: Orthopaedic Surgery | Admitting: Orthopaedic Surgery

## 2017-10-20 DIAGNOSIS — M50322 Other cervical disc degeneration at C5-C6 level: Secondary | ICD-10-CM | POA: Insufficient documentation

## 2017-10-20 DIAGNOSIS — M4802 Spinal stenosis, cervical region: Secondary | ICD-10-CM | POA: Insufficient documentation

## 2017-10-20 DIAGNOSIS — M50221 Other cervical disc displacement at C4-C5 level: Secondary | ICD-10-CM | POA: Diagnosis not present

## 2017-10-20 DIAGNOSIS — M542 Cervicalgia: Secondary | ICD-10-CM | POA: Insufficient documentation

## 2017-10-20 DIAGNOSIS — M2578 Osteophyte, vertebrae: Secondary | ICD-10-CM | POA: Diagnosis not present

## 2017-10-21 ENCOUNTER — Ambulatory Visit (INDEPENDENT_AMBULATORY_CARE_PROVIDER_SITE_OTHER): Payer: Medicare Other | Admitting: Orthopaedic Surgery

## 2017-10-21 ENCOUNTER — Encounter: Payer: Self-pay | Admitting: Orthopaedic Surgery

## 2017-10-21 ENCOUNTER — Ambulatory Visit (INDEPENDENT_AMBULATORY_CARE_PROVIDER_SITE_OTHER): Payer: Medicare Other

## 2017-10-21 VITALS — BP 156/93 | HR 116 | Ht 74.0 in | Wt 160.0 lb

## 2017-10-21 DIAGNOSIS — G8929 Other chronic pain: Secondary | ICD-10-CM

## 2017-10-21 DIAGNOSIS — M25561 Pain in right knee: Secondary | ICD-10-CM

## 2017-10-21 DIAGNOSIS — M542 Cervicalgia: Secondary | ICD-10-CM

## 2017-10-21 NOTE — Progress Notes (Signed)
[name], female DOB:06/05/1980, 37 y.o. JYN:[id number]  Chief Complaint  Patient presents with  . Neck Pain  . Results    review MRI cervical spine   . Knee Pain    right    HPI  Courtney Patterson is a 37 y.o. female who has had neck pain.  She had a MRI which showed: IMPRESSION: 1. Shallow right paracentral and medial foraminal disc protrusion at C4-5 with potential irritation of the right C5 nerve root. 2. Degenerative disc disease at C5-6 with osteophytic ridging, uncinate spurring and a small central and slightly right paracentral disc protrusion. There is focal impression on the ventral thecal sac and mild right foraminal encroachment. 3. Left-sided disc osteophyte complex at C6-7 with left foraminal stenosis likely effecting the left C7 nerve root.  I have informed her of the findings and will have a neurosurgeon see her.  She has new problem:  Right knee pain laterally.  She has moved recently and began having the pain.  She has a bruise the size of an old half dollar on the lateral side of the right knee.  She does not remember hitting it.  She has no giving way.  She has deep pain.  She is taking Tramadol from her regular doctor.   Body mass index is 20.54 kg/m.  ROS  Review of Systems  Constitutional: Positive for activity change.  Musculoskeletal: Positive for arthralgias, back pain, gait problem, joint swelling and neck pain.  All other systems reviewed and are negative.   All other systems reviewed and are negative.  Past Medical History:  Diagnosis Date  . Knee pain, chronic     Past Surgical History:  Procedure Laterality Date  . ABDOMINAL HYSTERECTOMY    . DENTAL RESTORATION/EXTRACTION WITH X-RAY    . KNEE ARTHROSCOPY Right 01/25/2015   Procedure: ARTHROSCOPY KNEE;  Surgeon: Darreld McleanWayne Kla Bily, MD;  Location: [facility];  Service: Orthopedics;  Laterality: Right;  . KNEE ARTHROSCOPY WITH MEDIAL MENISECTOMY Right 01/25/2015   Procedure:  KNEE ARTHROSCOPY WITH PARTIAL MEDIAL MENISECTOMY;  Surgeon: Darreld McleanWayne Kalynne Womac, MD;  Location: [facility];  Service: Orthopedics;  Laterality: Right;  . TUBAL LIGATION      Family History  Problem Relation Age of Onset  . Cancer Mother   . Cancer Father     Social History Social History   Tobacco Use  . Smoking status: Current Every Day Smoker    Packs/day: 1.00    Years: 18.00    Pack years: 18.00  . Smokeless tobacco: Never Used  Substance Use Topics  . Alcohol use: No    Alcohol/week: 0.0 standard drinks    Comment: remote marjuana  . Drug use: Yes    Types: Marijuana    Comment: last use a week and half ago    No Known Allergies  Current Outpatient Medications  Medication Sig Dispense Refill  . citalopram (CELEXA) 20 MG tablet Take 20 mg by mouth daily.    . cyclobenzaprine (FLEXERIL) 10 MG tablet Take 1 tablet (10 mg total) by mouth at bedtime. One tablet every night at bedtime as needed for spasm. 30 tablet 0  . naproxen (NAPROSYN) 500 MG tablet Take 1 tablet (500 mg total) by mouth 2 (two) times daily with a meal. 60 tablet 5  . omeprazole (PRILOSEC) 20 MG capsule Take 20 mg by mouth 2 (two) times daily.  0  . sertraline (ZOLOFT) 50 MG tablet Take 50 mg by mouth daily.  2   No  current facility-administered medications for this visit.      Physical Exam  Blood pressure (!) 156/93, pulse (!) 116, height 6\' 2"  (1.88 m), weight 160 lb (72.6 kg), last menstrual period 05/23/2015.  Constitutional: overall normal hygiene, normal nutrition, well developed, normal grooming, normal body habitus. Assistive device:none  Musculoskeletal: gait and station Limp right, muscle tone and strength are normal, no tremors or atrophy is present.  .  Neurological: coordination overall normal.  Deep tendon reflex/nerve stretch intact.  Sensation normal.  Cranial nerves II-XII intact.   Skin:   Normal overall no scars, lesions, ulcers or rashes. No psoriasis.  Psychiatric: Alert and  oriented x 3.  Recent memory intact, remote memory unclear.  Normal mood and affect. Well groomed.  Good eye contact.  Cardiovascular: overall no swelling, no varicosities, no edema bilaterally, normal temperatures of the legs and arms, no clubbing, cyanosis and good capillary refill.  Lymphatic: palpation is normal.  Her neck is tender but has full motion. NV intact.  The right lower extremity is examined:  Inspection:  Thigh:  Non-tender and no defects  Knee has swelling 1/2+ effusion.                        Joint tenderness is present                        Patient is tender over the medial joint line  Lower Leg:  Has normal appearance and no tenderness or defects  Ankle:  Non-tender and no defects  Foot:  Non-tender and no defects Range of Motion:  Knee:  Range of motion is: 0-105                        Crepitus is  present  Ankle:  Range of motion is normal. Strength and Tone:  The right lower extremity has normal strength and tone. Stability:  Knee:  The knee is stable.  Ankle:  The ankle is stable. All other systems reviewed and are negative   The patient has been educated about the nature of the problem(s) and counseled on treatment options.  The patient appeared to understand what I have discussed and is in agreement with it.  Encounter Diagnoses  Name Primary?  . Cervicalgia Yes  . Chronic pain of right knee     X-rays were done of the right knee, reported separately.  PLAN Call if any problems.  Precautions discussed.  Continue current medications.   Return to clinic 1 week   A rx for crutches was given as well as she tried a knee sleeve.  To see neurosurgeon for the neck.  Electronically Signed Darreld McleanWayne Alliene Klugh, MD 8/14/20193:42 PM

## 2017-10-22 ENCOUNTER — Telehealth: Payer: Self-pay

## 2017-10-22 MED ORDER — HYDROCODONE-ACETAMINOPHEN 5-325 MG PO TABS: ORAL_TABLET | ORAL | 0 refills | Status: DC

## 2017-10-22 NOTE — Telephone Encounter (Signed)
[place] from pharmacy called and stated that pt did not have proof of incident from the police department but he is willing to give pt a refill of pain medication if you "OK" it. Pt will be informed that her insurance will not pay for this refill since it's too soon for her refill. Please assist.

## 2017-10-28 ENCOUNTER — Ambulatory Visit (INDEPENDENT_AMBULATORY_CARE_PROVIDER_SITE_OTHER): Payer: Medicare Other | Admitting: Orthopaedic Surgery

## 2017-10-28 ENCOUNTER — Encounter: Payer: Self-pay | Admitting: Orthopaedic Surgery

## 2017-10-28 VITALS — BP 119/76 | HR 113 | Temp 98.3°F | Ht 74.0 in | Wt 160.0 lb

## 2017-10-28 DIAGNOSIS — G8929 Other chronic pain: Secondary | ICD-10-CM | POA: Diagnosis not present

## 2017-10-28 DIAGNOSIS — M25561 Pain in right knee: Secondary | ICD-10-CM | POA: Diagnosis not present

## 2017-10-28 DIAGNOSIS — M542 Cervicalgia: Secondary | ICD-10-CM

## 2017-10-28 NOTE — Addendum Note (Signed)
Addended by: Gevena CottonSACKFIELD, Abass Misener L on: 10/28/2017 03:10 PM   Modules accepted: Orders

## 2017-10-28 NOTE — Progress Notes (Signed)
Patient WU:Courtney Patterson:Courtney Patterson, female DOB:1980-07-23, 37 y.o. JYN:[id number]  Chief Complaint  Patient presents with  . Knee Pain    Recheck on right knee pain.    HPI  Courtney Patterson is a 37 y.o. female who has more and more pain of the right knee with more swelling and giving way now.  She is getting worse.  She has a knee sleeve that helps some.  I am concerned about a possible meniscus tear and will get a MRI of the knee.   Body mass index is 20.54 kg/m.  ROS  Review of Systems  Constitutional: Positive for activity change.  Musculoskeletal: Positive for arthralgias, back pain, gait problem, joint swelling and neck pain.  All other systems reviewed and are negative.   All other systems reviewed and are negative.  The following is a summary of the past history medically, past history surgically, known current medicines, social history and family history.  This information is gathered electronically by the computer from prior information and documentation.  I review this each visit and have found including this information at this point in the chart is beneficial and informative.    Past Medical History:  Diagnosis Date  . Knee pain, chronic     Past Surgical History:  Procedure Laterality Date  . ABDOMINAL HYSTERECTOMY    . DENTAL RESTORATION/EXTRACTION WITH X-RAY    . KNEE ARTHROSCOPY Right 01/25/2015   Procedure: ARTHROSCOPY KNEE;  Surgeon: Darreld McleanWayne Dayonna Selbe, MD;  Location: [facility];  Service: Orthopedics;  Laterality: Right;  . KNEE ARTHROSCOPY WITH MEDIAL MENISECTOMY Right 01/25/2015   Procedure: KNEE ARTHROSCOPY WITH PARTIAL MEDIAL MENISECTOMY;  Surgeon: Darreld McleanWayne Aby Gessel, MD;  Location: [facility];  Service: Orthopedics;  Laterality: Right;  . TUBAL LIGATION      Family History  Problem Relation Age of Onset  . Cancer Mother   . Cancer Father     Social History Social History   Tobacco Use  . Smoking status: Current Every Day Smoker    Packs/day: 1.00    Years: 18.00     Pack years: 18.00  . Smokeless tobacco: Never Used  Substance Use Topics  . Alcohol use: No    Alcohol/week: 0.0 standard drinks    Comment: remote marjuana  . Drug use: Yes    Types: Marijuana    Comment: last use a week and half ago    No Known Allergies  Current Outpatient Medications  Medication Sig Dispense Refill  . citalopram (CELEXA) 20 MG tablet Take 20 mg by mouth daily.    . cyclobenzaprine (FLEXERIL) 10 MG tablet Take 1 tablet (10 mg total) by mouth at bedtime. One tablet every night at bedtime as needed for spasm. 30 tablet 0  . HYDROcodone-acetaminophen (NORCO/VICODIN) 5-325 MG tablet One tablet every four hours as needed for acute pain.  Limit of five days per [place] statue. 30 tablet 0  . naproxen (NAPROSYN) 500 MG tablet Take 1 tablet (500 mg total) by mouth 2 (two) times daily with a meal. 60 tablet 5  . omeprazole (PRILOSEC) 20 MG capsule Take 20 mg by mouth 2 (two) times daily.  0  . sertraline (ZOLOFT) 50 MG tablet Take 50 mg by mouth daily.  2   No current facility-administered medications for this visit.      Physical Exam  Blood pressure 119/76, pulse (!) 113, temperature 98.3 F (36.8 C), height 6\' 2"  (1.88 m), weight 160 lb (72.6 kg), last menstrual period 05/23/2015.  Constitutional: overall  normal hygiene, normal nutrition, well developed, normal grooming, normal body habitus. Assistive device:right knee sleeve  Musculoskeletal: gait and station Limp right, muscle tone and strength are normal, no tremors or atrophy is present.  .  Neurological: coordination overall normal.  Deep tendon reflex/nerve stretch intact.  Sensation normal.  Cranial nerves II-XII intact.   Skin:   Normal overall no scars, lesions, ulcers or rashes. No psoriasis.  Psychiatric: Alert and oriented x 3.  Recent memory intact, remote memory unclear.  Normal mood and affect. Well groomed.  Good eye contact.  Cardiovascular: overall no swelling, no varicosities, no edema  bilaterally, normal temperatures of the legs and arms, no clubbing, cyanosis and good capillary refill.  Lymphatic: palpation is normal.  Right knee has ROM of 0 to 110 with medial pain, positive medial McMurray, effusion 1/2+, limp to the right, NV intact.  All other systems reviewed and are negative   The patient has been educated about the nature of the problem(s) and counseled on treatment options.  The patient appeared to understand what I have discussed and is in agreement with it.  Encounter Diagnosis  Name Primary?  . Chronic pain of right knee Yes    PLAN Call if any problems.  Precautions discussed.  Continue current medications.   Return to clinic after MRI of the knee on the right.   Electronically Signed Darreld McleanWayne Ozetta Flatley, MD 8/21/20192:53 PM

## 2017-10-30 ENCOUNTER — Ambulatory Visit (HOSPITAL_COMMUNITY)
Admission: RE | Admit: 2017-10-30 | Discharge: 2017-10-30 | Disposition: A | Discharge status: Home / Self Care | Payer: Medicare Other | Source: Ambulatory Visit | Attending: Orthopaedic Surgery | Admitting: Orthopaedic Surgery

## 2017-10-30 DIAGNOSIS — M25561 Pain in right knee: Secondary | ICD-10-CM | POA: Insufficient documentation

## 2017-10-30 DIAGNOSIS — G8929 Other chronic pain: Secondary | ICD-10-CM | POA: Insufficient documentation

## 2017-10-30 DIAGNOSIS — R937 Abnormal findings on diagnostic imaging of other parts of musculoskeletal system: Secondary | ICD-10-CM | POA: Insufficient documentation

## 2017-11-04 ENCOUNTER — Ambulatory Visit (INDEPENDENT_AMBULATORY_CARE_PROVIDER_SITE_OTHER): Payer: Medicare Other | Admitting: Orthopaedic Surgery

## 2017-11-04 ENCOUNTER — Encounter: Payer: Self-pay | Admitting: Orthopaedic Surgery

## 2017-11-04 VITALS — BP 124/99 | HR 87 | Ht 74.0 in | Wt 160.0 lb

## 2017-11-04 DIAGNOSIS — M542 Cervicalgia: Secondary | ICD-10-CM | POA: Diagnosis not present

## 2017-11-04 DIAGNOSIS — G8929 Other chronic pain: Secondary | ICD-10-CM | POA: Diagnosis not present

## 2017-11-04 DIAGNOSIS — M25561 Pain in right knee: Secondary | ICD-10-CM

## 2017-11-04 MED ORDER — HYDROCODONE-ACETAMINOPHEN 5-325 MG PO TABS: ORAL_TABLET | ORAL | 0 refills | Status: DC

## 2017-11-04 NOTE — Progress Notes (Signed)
Patient ZO:XWRUE ANNARAE Patterson, female DOB:February 13, 1981, 37 y.o. AVW:[id number]  Chief Complaint  Patient presents with  . Knee Pain    Right knee pain, MRI results.    HPI  Courtney Patterson is a 37 y.o. female who has right knee pain and buckling.  She had a MRI which showed: IMPRESSION: 1. Partial meniscectomy change of the posterior horn of the medial meniscus. A tiny wedge-shaped defect is noted along the inferior articular surface of the remaining posterior horn that could potentially represent a new meniscal tear this is indeterminate in the absence of preoperative images for comparison. 2. Intact cruciate and collateral ligaments.  I have explained the findings to her.  She has less pain today.  She has begun to take care of her infant grandchild who accompanies her today to the office.  She will decide if and when she might want an arthroscopy of the right knee.  She will let me know.    Body mass index is 20.54 kg/m.  ROS  Review of Systems  All other systems reviewed and are negative.  The following is a summary of the past history medically, past history surgically, known current medicines, social history and family history.  This information is gathered electronically by the computer from prior information and documentation.  I review this each visit and have found including this information at this point in the chart is beneficial and informative.    Past Medical History:  Diagnosis Date  . Knee pain, chronic     Past Surgical History:  Procedure Laterality Date  . ABDOMINAL HYSTERECTOMY    . DENTAL RESTORATION/EXTRACTION WITH X-RAY    . KNEE ARTHROSCOPY Right 01/25/2015   Procedure: ARTHROSCOPY KNEE;  Surgeon: Darreld Mclean, MD;  Location: [facility];  Service: Orthopedics;  Laterality: Right;  . KNEE ARTHROSCOPY WITH MEDIAL MENISECTOMY Right 01/25/2015   Procedure: KNEE ARTHROSCOPY WITH PARTIAL MEDIAL MENISECTOMY;  Surgeon: Darreld Mclean, MD;  Location: [facility];   Service: Orthopedics;  Laterality: Right;  . TUBAL LIGATION      Family History  Problem Relation Age of Onset  . Cancer Mother   . Cancer Father     Social History Social History   Tobacco Use  . Smoking status: Current Every Day Smoker    Packs/day: 1.00    Years: 18.00    Pack years: 18.00  . Smokeless tobacco: Never Used  Substance Use Topics  . Alcohol use: No    Alcohol/week: 0.0 standard drinks    Comment: remote marjuana  . Drug use: Yes    Types: Marijuana    Comment: last use a week and half ago    No Known Allergies  Current Outpatient Medications  Medication Sig Dispense Refill  . citalopram (CELEXA) 20 MG tablet Take 20 mg by mouth daily.    . cyclobenzaprine (FLEXERIL) 10 MG tablet Take 1 tablet (10 mg total) by mouth at bedtime. One tablet every night at bedtime as needed for spasm. 30 tablet 0  . HYDROcodone-acetaminophen (NORCO/VICODIN) 5-325 MG tablet One tablet every four hours as needed for acute pain.  Limit of five days per [place] statue. 30 tablet 0  . naproxen (NAPROSYN) 500 MG tablet Take 1 tablet (500 mg total) by mouth 2 (two) times daily with a meal. 60 tablet 5  . omeprazole (PRILOSEC) 20 MG capsule Take 20 mg by mouth 2 (two) times daily.  0  . sertraline (ZOLOFT) 50 MG tablet Take 50 mg by mouth daily.  2   No current facility-administered medications for this visit.      Physical Exam  Blood pressure (!) 124/99, pulse 87, height 6\' 2"  (1.88 m), weight 160 lb (72.6 kg), last menstrual period 05/23/2015.  Constitutional: overall normal hygiene, normal nutrition, well developed, normal grooming, normal body habitus. Assistive device:none  Musculoskeletal: gait and station Limp right knee, muscle tone and strength are normal, no tremors or atrophy is present.  .  Neurological: coordination overall normal.  Deep tendon reflex/nerve stretch intact.  Sensation normal.  Cranial nerves II-XII intact.   Skin:   Normal overall no scars,  lesions, ulcers or rashes. No psoriasis.  Psychiatric: Alert and oriented x 3.  Recent memory intact, remote memory unclear.  Normal mood and affect. Well groomed.  Good eye contact.  Cardiovascular: overall no swelling, no varicosities, no edema bilaterally, normal temperatures of the legs and arms, no clubbing, cyanosis and good capillary refill.  Lymphatic: palpation is normal.  Right knee is tender and has a slight effusion, ROM 0 to 115 today, slight crepitus, pain medially and positive medial McMurray.  NV intact.  No limp today.  All other systems reviewed and are negative   The patient has been educated about the nature of the problem(s) and counseled on treatment options.  The patient appeared to understand what I have discussed and is in agreement with it.  Encounter Diagnoses  Name Primary?  . Chronic pain of right knee Yes  . Cervicalgia     PLAN Call if any problems.  Precautions discussed.  Continue current medications.   Return to clinic 1 month   [facility]  Cont[facility] Sub[facility] [place] System web site prior to prescribing narcotic medicine for this patient.  Electronically Signed Darreld McleanWayne Prince Couey, MD 8/28/20192:49 PM

## 2017-11-16 DIAGNOSIS — M542 Cervicalgia: Secondary | ICD-10-CM | POA: Insufficient documentation

## 2017-12-02 ENCOUNTER — Encounter: Payer: Self-pay | Admitting: Orthopaedic Surgery

## 2017-12-02 ENCOUNTER — Ambulatory Visit: Payer: Medicare Other | Admitting: Orthopaedic Surgery

## 2017-12-02 VITALS — BP 118/55 | HR 86 | Ht 74.0 in | Wt 165.0 lb

## 2017-12-02 DIAGNOSIS — G8929 Other chronic pain: Secondary | ICD-10-CM | POA: Diagnosis not present

## 2017-12-02 DIAGNOSIS — M25561 Pain in right knee: Secondary | ICD-10-CM | POA: Diagnosis not present

## 2017-12-02 MED ORDER — HYDROCODONE-ACETAMINOPHEN 5-325 MG PO TABS: ORAL_TABLET | ORAL | 0 refills | Status: DC

## 2017-12-02 NOTE — Progress Notes (Signed)
Patient ZO:XWRUE YETTA Patterson, female DOB:09-07-1980, 37 y.o. AVW:[id number]  Chief Complaint  Patient presents with  . Knee Pain    right    HPI  Courtney Patterson is a 37 y.o. female who has continued pain of the right knee.  She had initially said she did not want to have surgery but now feels she needs to as her knee is worse and really hurting her.  Her MRI had shown a potentially new tear of the medial meniscus that remained from old arthroscopy.  I will have her see Dr. Romeo Apple.   Body mass index is 21.18 kg/m.  ROS  Review of Systems  Constitutional: Positive for activity change.  Musculoskeletal: Positive for arthralgias, back pain, gait problem, joint swelling and neck pain.  All other systems reviewed and are negative.   All other systems reviewed and are negative.  The following is a summary of the past history medically, past history surgically, known current medicines, social history and family history.  This information is gathered electronically by the computer from prior information and documentation.  I review this each visit and have found including this information at this point in the chart is beneficial and informative.    Past Medical History:  Diagnosis Date  . Knee pain, chronic     Past Surgical History:  Procedure Laterality Date  . ABDOMINAL HYSTERECTOMY    . DENTAL RESTORATION/EXTRACTION WITH X-RAY    . KNEE ARTHROSCOPY Right 01/25/2015   Procedure: ARTHROSCOPY KNEE;  Surgeon: Darreld Mclean, MD;  Location: [facility];  Service: Orthopedics;  Laterality: Right;  . KNEE ARTHROSCOPY WITH MEDIAL MENISECTOMY Right 01/25/2015   Procedure: KNEE ARTHROSCOPY WITH PARTIAL MEDIAL MENISECTOMY;  Surgeon: Darreld Mclean, MD;  Location: [facility];  Service: Orthopedics;  Laterality: Right;  . TUBAL LIGATION      Family History  Problem Relation Age of Onset  . Cancer Mother   . Cancer Father     Social History Social History   Tobacco Use  . Smoking status:  Current Every Day Smoker    Packs/day: 1.00    Years: 18.00    Pack years: 18.00  . Smokeless tobacco: Never Used  Substance Use Topics  . Alcohol use: No    Alcohol/week: 0.0 standard drinks    Comment: remote marjuana  . Drug use: Yes    Types: Marijuana    Comment: last use a week and half ago    No Known Allergies  Current Outpatient Medications  Medication Sig Dispense Refill  . citalopram (CELEXA) 20 MG tablet Take 20 mg by mouth daily.    . cyclobenzaprine (FLEXERIL) 10 MG tablet Take 1 tablet (10 mg total) by mouth at bedtime. One tablet every night at bedtime as needed for spasm. 30 tablet 0  . HYDROcodone-acetaminophen (NORCO/VICODIN) 5-325 MG tablet One tablet by mouth every six hours as needed for pain.  Seven day limit 28 tablet 0  . naproxen (NAPROSYN) 500 MG tablet Take 1 tablet (500 mg total) by mouth 2 (two) times daily with a meal. 60 tablet 5  . omeprazole (PRILOSEC) 20 MG capsule Take 20 mg by mouth 2 (two) times daily.  0  . sertraline (ZOLOFT) 50 MG tablet Take 50 mg by mouth daily.  2   No current facility-administered medications for this visit.      Physical Exam  Blood pressure (!) 118/55, pulse 86, height 6\' 2"  (1.88 m), weight 165 lb (74.8 kg), last menstrual period 05/23/2015.  Constitutional: overall  normal hygiene, normal nutrition, well developed, normal grooming, normal body habitus. Assistive device:none  Musculoskeletal: gait and station Limp right, muscle tone and strength are normal, no tremors or atrophy is present.  .  Neurological: coordination overall normal.  Deep tendon reflex/nerve stretch intact.  Sensation normal.  Cranial nerves II-XII intact.   Skin:   Normal overall no scars, lesions, ulcers or rashes. No psoriasis.  Psychiatric: Alert and oriented x 3.  Recent memory intact, remote memory unclear.  Normal mood and affect. Well groomed.  Good eye contact.  Cardiovascular: overall no swelling, no varicosities, no edema  bilaterally, normal temperatures of the legs and arms, no clubbing, cyanosis and good capillary refill.  Lymphatic: palpation is normal.  Right knee has effusion, mild, medial joint line pain, ROM 0 to 115, positive medial McMurray, limp to the right, NV intact.  All other systems reviewed and are negative   The patient has been educated about the nature of the problem(s) and counseled on treatment options.  The patient appeared to understand what I have discussed and is in agreement with it.  Encounter Diagnosis  Name Primary?  . Chronic pain of right knee Yes    PLAN Call if any problems.  Precautions discussed.  Continue current medications.   Return to clinic to see Dr. Romeo Apple   Electronically Signed Darreld Mclean, MD 9/25/201910:23 AM

## 2017-12-17 ENCOUNTER — Telehealth: Payer: Self-pay | Admitting: Orthopaedic Surgery

## 2017-12-17 MED ORDER — HYDROCODONE-ACETAMINOPHEN 5-325 MG PO TABS: ORAL_TABLET | ORAL | 0 refills | Status: DC

## 2017-12-17 NOTE — Telephone Encounter (Signed)
Hydrocodone-Acetaminophen  5/325 mg  Qty  28 Tablets  PATIENT USES [facility]

## 2017-12-23 ENCOUNTER — Ambulatory Visit (INDEPENDENT_AMBULATORY_CARE_PROVIDER_SITE_OTHER): Payer: Medicare Other | Admitting: Orthopedic Surgery

## 2017-12-23 ENCOUNTER — Encounter: Payer: Self-pay | Admitting: Orthopedic Surgery

## 2017-12-23 VITALS — BP 121/79 | HR 79 | Ht 74.0 in | Wt 163.0 lb

## 2017-12-23 DIAGNOSIS — M23321 Other meniscus derangements, posterior horn of medial meniscus, right knee: Secondary | ICD-10-CM

## 2017-12-23 MED ORDER — HYDROCODONE-ACETAMINOPHEN 5-325 MG PO TABS: 1.0000 | ORAL_TABLET | ORAL | 0 refills | Status: AC | PRN

## 2017-12-23 NOTE — Progress Notes (Signed)
PREOP CONSULT/REFERRAL INTRA-OFFICE FROM DR Gaylene Brooks   Chief Complaint  Patient presents with  . Knee Pain    surgical consult right knee    37 years old presented in August with moderate to severe right knee pain it looks like she had a knee sleeve and tramadol eventually converted to hydrocodone for ongoing pain swelling and giving way.  Eventually had an MRI which showed a torn medial meniscus.  Dr. Hilda Lias had performed prior medial meniscectomy on her back in 2016 seem to do well except did not tolerate therapy well here there is no history of trauma There are records indicating that she has had some incidents with losing her pain medication so that will be an issue going forward I have informed her that she can have medication for 2 weeks after surgery and should be okay to do home exercises     Review of Systems  Musculoskeletal: Positive for joint pain, myalgias and neck pain.  All other systems reviewed and are negative.    Past Medical History:  Diagnosis Date  . Knee pain, chronic     Past Surgical History:  Procedure Laterality Date  . ABDOMINAL HYSTERECTOMY    . DENTAL RESTORATION/EXTRACTION WITH X-RAY    . KNEE ARTHROSCOPY Right 01/25/2015   Procedure: ARTHROSCOPY KNEE;  Surgeon: Darreld Mclean, MD;  Location: [facility];  Service: Orthopedics;  Laterality: Right;  . KNEE ARTHROSCOPY WITH MEDIAL MENISECTOMY Right 01/25/2015   Procedure: KNEE ARTHROSCOPY WITH PARTIAL MEDIAL MENISECTOMY;  Surgeon: Darreld Mclean, MD;  Location: [facility];  Service: Orthopedics;  Laterality: Right;  . TUBAL LIGATION      Family History  Problem Relation Age of Onset  . Cancer Mother   . Cancer Father    Social History   Tobacco Use  . Smoking status: Current Every Day Smoker    Packs/day: 1.00    Years: 18.00    Pack years: 18.00  . Smokeless tobacco: Never Used  Substance Use Topics  . Alcohol use: No    Alcohol/week: 0.0 standard drinks    Comment: remote marjuana  . Drug  use: Yes    Types: Marijuana    Comment: last use a week and half ago    No Known Allergies   Current Meds  Medication Sig  . HYDROcodone-acetaminophen (NORCO/VICODIN) 5-325 MG tablet Take 1 tablet by mouth every 4 (four) hours as needed for up to 7 days for moderate pain.  . methocarbamol (ROBAXIN) 750 MG tablet Take 750 mg by mouth 3 (three) times daily.  . sertraline (ZOLOFT) 50 MG tablet Take 50 mg by mouth daily.  . [DISCONTINUED] HYDROcodone-acetaminophen (NORCO/VICODIN) 5-325 MG tablet One tablet by mouth every six hours as needed for pain.  Seven day limit    BP 121/79   Pulse 79   Ht 6\' 2"  (1.88 m)   Wt 163 lb (73.9 kg)   LMP 05/23/2015   BMI 20.93 kg/m   Physical Exam  Constitutional: She is oriented to person, place, and time. She appears well-developed and well-nourished.  Neurological: She is alert and oriented to person, place, and time.  Psychiatric: She has a normal mood and affect. Judgment normal.  Vitals reviewed.   Ortho Exam   Slight antalgic gait.  Left knee normal alignment no tenderness or swelling full range of motion no instability muscle tone and strength normal no tremors skin warm dry and intact without erythema neurovascular exam is intact  Right knee painful flexion tenderness over the medial  compartment small effusion knee flexion close to 90 degrees no tremor muscle tone is normal no instability is noted McMurray sign was positive neurovascular exam is intact skin was warm dry and intact.  MEDICAL DECISION SECTION  Image interpretation Previous x-rays have been performed on this 14th joint space narrowing seen on the medial compartment mild valgus alignment looks normal   MRI was also obtained on August 23 she has a complex tear of her medial meniscus as well as partial cartilage loss on the medial compartment, bone marrow heterogeneity nonspecific  Reports are also reviewed and also referenced back into the medical record Encounter  Diagnosis  Name Primary?  . Derangement of posterior horn of medial meniscus of right knee Yes     PLAN:   Surgical procedure planned: sark medial menisectomy   The procedure has been fully reviewed with the patient; The risks and benefits of surgery have been discussed and explained and understood. Alternative treatment has also been reviewed, questions were encouraged and answered. The postoperative plan is also been reviewed.  Nonsurgical treatment as described in the history and physical section was attempted and unsuccessful and the patient has agreed to proceed with surgical intervention to improve their situation.  Meds ordered this encounter  Medications  . HYDROcodone-acetaminophen (NORCO/VICODIN) 5-325 MG tablet    Sig: Take 1 tablet by mouth every 4 (four) hours as needed for up to 7 days for moderate pain.    Dispense:  42 tablet    Refill:  0    Fuller Canada, MD 12/23/2017 8:52 AM

## 2017-12-23 NOTE — Patient Instructions (Signed)
Meniscus Injury, Arthroscopy Arthroscopy is a surgical procedure that involves the use of a small scope that has a camera and surgical instruments on the end (arthroscope). An arthroscope can be used to repair your meniscus injury.  LET YOUR HEALTH CARE PROVIDER KNOW ABOUT:  Any allergies you have.  All medicines you are taking, including vitamins, herbs, eyedrops, creams, and over-the-counter medicines.  Any recent colds or infections you have had or currently have.  Previous problems you or members of your family have had with the use of anesthetics.  Any blood disorders or blood clotting problems you have.  Previous surgeries you have had.  Medical conditions you have. RISKS AND COMPLICATIONS Generally, this is a safe procedure. However, as with any procedure, problems can occur. Possible problems include:  Damage to nerves or blood vessels.  Excess bleeding.  Blood clots.  Infection. BEFORE THE PROCEDURE  Do not eat or drink for 6-8 hours before the procedure.  Take medicines as directed by your surgeon. Ask your surgeon about changing or stopping your regular medicines.  You may have lab tests the morning of surgery. PROCEDURE  You will be given one of the following:   A medicine that numbs the area (local anesthesia).  A medicine that makes you go to sleep (general anesthesia).  A medicine injected into your spine that numbs your body below the waist (spinal anesthesia). Most often, several small cuts (incisions) are made in the knee. The arthroscope and instruments go into the incisions to repair the damage. The torn portion of the meniscus is removed.   AFTER THE PROCEDURE  You will be taken to the recovery area where your progress will be monitored. When you are awake, stable, and taking fluids without complications, you will be allowed to go home. This is usually the same day. A torn or stretched ligament (ligament sprain) may take 6-8 weeks to heal.   It  takes about the 4-6 WEEKS if your surgeon removed a torn meniscus.  A repaired meniscus may require 6-12 weeks of recovery time.  A torn ligament needing reconstructive surgery may take 6-12 months to heal fully.   This information is not intended to replace advice given to you by your health care provider. Make sure you discuss any questions you have with your health care provider. You have decided to proceed with operative arthroscopy of the knee. You have decided not to continue with nonoperative measures such as but not limited to oral medication, weight loss, activity modification, physical therapy, bracing, or injection.  We will perform operative arthroscopy of the knee. Some of the risks associated with arthroscopic surgery of the knee include but are not limited to Bleeding Infection Swelling Stiffness Blood clot Pain  If you're not comfortable with these risks and would like to continue with nonoperative treatment please let Dr. Harrison know prior to your surgery.   Document Released: 02/22/2000 Document Revised: 03/01/2013 Document Reviewed: 07/23/2012 Else[employer] Interactive Patient Education 2016 [employer] You have decided to proceed with operative arthroscopy of the knee. You have decided not to continue with nonoperative measures such as but not limited to oral medication, weight loss, activity modification, physical therapy, bracing, or injection.  In compliance with recent [place] law in federal regulation regarding opioid use and abuse and addiction, we will taper (stop) opioid medication after 2 weeks.  We will perform operative arthroscopy of the knee. Some of the risks associated with arthroscopic surgery of the knee include but are not limited to   Bleeding Infection Swelling Stiffness Blood clot Pain  If you're not comfortable with these risks and would like to continue with nonoperative treatment please let Dr. Harrison know prior to your surgery. 

## 2017-12-29 ENCOUNTER — Encounter (HOSPITAL_COMMUNITY): Payer: Self-pay

## 2017-12-29 ENCOUNTER — Encounter (HOSPITAL_COMMUNITY)
Admission: RE | Admit: 2017-12-29 | Discharge: 2017-12-29 | Disposition: A | Discharge status: Home / Self Care | Payer: Medicare Other | Source: Ambulatory Visit | Attending: Orthopedic Surgery | Admitting: Orthopedic Surgery

## 2017-12-29 ENCOUNTER — Other Ambulatory Visit: Payer: Self-pay

## 2017-12-29 DIAGNOSIS — F419 Anxiety disorder, unspecified: Secondary | ICD-10-CM | POA: Diagnosis not present

## 2017-12-29 DIAGNOSIS — Z01812 Encounter for preprocedural laboratory examination: Secondary | ICD-10-CM | POA: Insufficient documentation

## 2017-12-29 DIAGNOSIS — M1990 Unspecified osteoarthritis, unspecified site: Secondary | ICD-10-CM | POA: Diagnosis not present

## 2017-12-29 DIAGNOSIS — F17210 Nicotine dependence, cigarettes, uncomplicated: Secondary | ICD-10-CM | POA: Insufficient documentation

## 2017-12-29 DIAGNOSIS — Z79899 Other long term (current) drug therapy: Secondary | ICD-10-CM

## 2017-12-29 DIAGNOSIS — G8929 Other chronic pain: Secondary | ICD-10-CM | POA: Insufficient documentation

## 2017-12-29 DIAGNOSIS — M25561 Pain in right knee: Secondary | ICD-10-CM | POA: Insufficient documentation

## 2017-12-29 DIAGNOSIS — M23221 Derangement of posterior horn of medial meniscus due to old tear or injury, right knee: Secondary | ICD-10-CM | POA: Diagnosis not present

## 2017-12-29 HISTORY — DX: Unspecified osteoarthritis, unspecified site: M19.90

## 2017-12-29 HISTORY — DX: Anxiety disorder, unspecified: F41.9

## 2017-12-29 LAB — CBC WITH DIFFERENTIAL/PLATELET
Abs Immature Granulocytes: 0.02 10*3/uL (ref 0.00–0.07)
Basophils Absolute: 0 10*3/uL (ref 0.0–0.1)
Basophils Relative: 0 %
EOS ABS: 0.2 10*3/uL (ref 0.0–0.5)
EOS PCT: 2 %
HEMATOCRIT: 43.7 % (ref 36.0–46.0)
Hemoglobin: 13.9 g/dL (ref 12.0–15.0)
Immature Granulocytes: 0 %
LYMPHS ABS: 1.9 10*3/uL (ref 0.7–4.0)
Lymphocytes Relative: 23 %
MCH: 30.7 pg (ref 26.0–34.0)
MCHC: 31.8 g/dL (ref 30.0–36.0)
MCV: 96.5 fL (ref 80.0–100.0)
MONO ABS: 0.4 10*3/uL (ref 0.1–1.0)
MONOS PCT: 5 %
Neutro Abs: 5.8 10*3/uL (ref 1.7–7.7)
Neutrophils Relative %: 70 %
Platelets: 236 10*3/uL (ref 150–400)
RBC: 4.53 MIL/uL (ref 3.87–5.11)
RDW: 12.2 % (ref 11.5–15.5)
WBC: 8.3 10*3/uL (ref 4.0–10.5)
nRBC: 0 % (ref 0.0–0.2)

## 2017-12-29 LAB — BASIC METABOLIC PANEL
ANION GAP: 3 — AB (ref 5–15)
BUN: 15 mg/dL (ref 6–20)
CALCIUM: 8.9 mg/dL (ref 8.9–10.3)
CO2: 28 mmol/L (ref 22–32)
CREATININE: 0.67 mg/dL (ref 0.44–1.00)
Chloride: 105 mmol/L (ref 98–111)
GFR calc Af Amer: >60 mL/min (ref >60)
GFR calc non Af Amer: >60 mL/min (ref >60)
GLUCOSE: 98 mg/dL (ref 70–99)
Potassium: 4.2 mmol/L (ref 3.5–5.1)
Sodium: 136 mmol/L (ref 135–145)

## 2017-12-29 NOTE — Patient Instructions (Signed)
Courtney Patterson  12/29/2017     @PREFPERIOPPHARMACY @   Your procedure is scheduled on  12/31/2017.  Report to [facility] at  615  A.M.  Call this number if you have problems the morning of surgery:  [phone]   Remember:  Do not eat or drink after midnight.                          Take these medicines the morning of surgery with A SIP OF WATER  Diclofenac, zoloft, robaxin ( if needed), hydrocodone ( if needed).    Do not wear jewelry, make-up or nail polish.  Do not wear lotions, powders, or perfumes, or deodorant.  Do not shave 48 hours prior to surgery.  Men may shave face and neck.  Do not bring valuables to the hospital.  [facility] is not responsible for any belongings or valuables.  Contacts, dentures or bridgework may not be worn into surgery.  Leave your suitcase in the car.  After surgery it may be brought to your room.  For patients admitted to the hospital, discharge time will be determined by your treatment team.  Patients discharged the day of surgery will not be allowed to drive home.   Name and phone number of your driver:   family Special instructions:  None  Please read over the following fact sheets that you were given. Anesthesia Post-op Instructions and Care and Recovery After Surgery      Knee Ligament Injury, Arthroscopy Arthroscopy is a surgical technique in which your health care provider examines your knee through a small, pencil-sized telescope (arthroscope). Often, repairs to injured ligaments can be done with instruments in the arthroscope. Arthroscopy is less invasive than open-knee surgery. Tell a health care provider about:  Any allergies you have.  All medicines you are taking, including vitamins, herbs, eye drops, creams, and over-the-counter medicines.  Any problems you or family members have had with anesthetic medicines.  Any blood disorders you have.  Any surgeries you have had.  Any medical conditions  you have. What are the risks? Generally, this is a safe procedure. However, as with any procedure, problems can occur. Possible problems include:  Infection.  Bleeding.  Stiffness.  What happens before the procedure?  Ask your health care provider about changing or stopping any regular medicines. Avoid taking aspirin or blood thinners as directed by your health care provider.  Do not eat or drink anything after midnight the night before surgery.  If you smoke, do not smoke for at least 2 weeks before your surgery.  Do not drink alcohol starting the day before your surgery.  Let your health care provider know if you develop a cold or any infection before your surgery.  Arrange for someone to drive you home after the surgery or after your hospital stay. Also arrange for someone to help you with activities during recovery. What happens during the procedure?  Small monitors will be put on your body. They are used to check your heart, blood pressure, and oxygen levels.  An IV access tube will be put into one of your veins. Medicine will be able to flow directly into your body through this IV tube.  You might be given a medicine to help you relax (sedative).  You will be given a medicine that makes you go to sleep (general anesthetic), and a breathing tube will be placed into  your lungs during the procedure.  Several small incisions are made in your knee. Saline fluid is placed into one of the incisions to expand the knee and clear away any blood in the knee.  Your health care provider will insert the arthroscope to examine the injured knee.  During arthroscopy, your health care provider may find a partial or complete tear in a ligament.  Tools can be inserted through the other incisions to repair the injured ligaments.  The incisions are then closed with absorbable stitches and covered with dressings. What happens after the procedure?  You will be taken to the recovery area  where you will be monitored.  When you are awake, stable, and taking fluids without problems, you will be allowed to go home. This information is not intended to replace advice given to you by your health care provider. Make sure you discuss any questions you have with your health care provider. Document Released: 02/22/2000 Document Revised: 08/02/2015 Document Reviewed: 10/06/2012 Elsevier Interactive Patient Education  2017 [employer]  Arthroscopic Knee Ligament Repair, Care After This sheet gives you information about how to care for yourself after your procedure. Your health care provider may also give you more specific instructions. If you have problems or questions, contact your health care provider. What can I expect after the procedure? After the procedure, it is common to have:  Pain in your knee.  Bruising and swelling on your knee, calf, and ankle for 3-4 days.  Fatigue.  Follow these instructions at home: If you have a brace or immobilizer:  Wear the brace or immobilizer as told by your health care provider. Remove it only as told by your health care provider.  Loosen the splint or immobilizer if your toes tingle, become numb, or turn cold and blue.  Keep the brace or immobilizer clean. Bathing  Do not take baths, swim, or use a hot tub until your health care provider approves. Ask your health care provider if you can take showers.  Keep your bandage (dressing) dry until your health care provider says that it can be removed. Cover it and your brace or immobilizer with a watertight covering when you take a shower. Incision care  Follow instructions from your health care provider about how to take care of your incision. Make sure you: ? Wash your hands with soap and water before you change your bandage (dressing). If soap and water are not available, use hand sanitizer. ? Change your dressing as told by your health care provider. ? Leave stitches (sutures), skin  glue, or adhesive strips in place. These skin closures may need to stay in place for 2 weeks or longer. If adhesive strip edges start to loosen and curl up, you may trim the loose edges. Do not remove adhesive strips completely unless your health care provider tells you to do that.  Check your incision area every day for signs of infection. Check for: ? More redness, swelling, or pain. ? More fluid or blood. ? Warmth. ? Pus or a bad smell. Managing pain, stiffness, and swelling  If directed, put ice on the affected area. ? If you have a removable brace or immobilizer, remove it as told by your health care provider. ? Put ice in a plastic bag. ? Place a towel between your skin and the bag or between your brace or immobilizer and the bag. ? Leave the ice on for 20 minutes, 2-3 times a day.  Move your toes often to avoid stiffness  and to lessen swelling.  Raise (elevate) the injured area above the level of your heart while you are sitting or lying down. Driving  Do not drive until your health care provider approves. If you have a brace or immobilizer on your leg, ask your health care provider when it is safe for you to drive.  Do not drive or use heavy machinery while taking prescription pain medicine. Activity  Rest as directed. Ask your health care provider what activities are safe for you.  Do physical therapy exercises as told by your health care provider. Physical therapy will help you regain strength and motion in your knee.  Follow instructions from your health care provider about: ? When you may start motion exercises. ? When you may start riding a stationary bike and doing other low-impact activities. ? When you may start to jog and do other high-impact activities. Safety  Do not use the injured limb to support your body weight until your health care provider says that you can. Use crutches as told by your health care provider. General instructions  Do not use any products  that contain nicotine or tobacco, such as cigarettes and e-cigarettes. These can delay bone healing. If you need help quitting, ask your health care provider.  To prevent or treat constipation while you are taking prescription pain medicine, your health care provider may recommend that you: ? Drink enough fluid to keep your urine clear or pale yellow. ? Take over-the-counter or prescription medicines. ? Eat foods that are high in fiber, such as fresh fruits and vegetables, whole grains, and beans. ? Limit foods that are high in fat and processed sugars, such as fried and sweet foods.  Take over-the-counter and prescription medicines only as told by your health care provider.  Keep all follow-up visits as told by your health care provider. This is important. Contact a health care provider if:  You have more redness, swelling, or pain around an incision.  You have more fluid or blood coming from an incision.  Your incision feels warm to the touch.  You have a fever.  You have pain or swelling in your knee, and it gets worse.  You have pain that does not get better with medicine. Get help right away if:  You have trouble breathing.  You have pus or a bad smell coming from an incision.  You have numbness and tingling near the knee joint. Summary  After the procedure, it is common to have knee pain with bruising and swelling on your knee, calf, and ankle.  Icing your knee and raising your leg above the level of your heart will help control the pain and the swelling.  Do physical therapy exercises as told by your health care provider. Physical therapy will help you regain strength and motion in your knee. This information is not intended to replace advice given to you by your health care provider. Make sure you discuss any questions you have with your health care provider. Document Released: 12/15/2012 Document Revised: 02/19/2016 Document Reviewed: 02/19/2016 Elsevier Interactive  Patient Education  2017 Elsevier Inc.  General Anesthesia, Adult General anesthesia is the use of medicines to make a person "go to sleep" (be unconscious) for a medical procedure. General anesthesia is often recommended when a procedure:  Is long.  Requires you to be still or in an unusual position.  Is major and can cause you to lose blood.  Is impossible to do without general anesthesia.  The medicines used  for general anesthesia are called general anesthetics. In addition to making you sleep, the medicines:  Prevent pain.  Control your blood pressure.  Relax your muscles.  Tell a health care provider about:  Any allergies you have.  All medicines you are taking, including vitamins, herbs, eye drops, creams, and over-the-counter medicines.  Any problems you or family members have had with anesthetic medicines.  Types of anesthetics you have had in the past.  Any bleeding disorders you have.  Any surgeries you have had.  Any medical conditions you have.  Any history of heart or lung conditions, such as heart failure, sleep apnea, or chronic obstructive pulmonary disease (COPD).  Whether you are pregnant or may be pregnant.  Whether you use tobacco, alcohol, marijuana, or street drugs.  Any history of Financial planner.  Any history of depression or anxiety. What are the risks? Generally, this is a safe procedure. However, problems may occur, including:  Allergic reaction to anesthetics.  Lung and heart problems.  Inhaling food or liquids from your stomach into your lungs (aspiration).  Injury to nerves.  Waking up during your procedure and being unable to move (rare).  Extreme agitation or a state of mental confusion (delirium) when you wake up from the anesthetic.  Air in the bloodstream, which can lead to stroke.  These problems are more likely to develop if you are having a major surgery or if you have an advanced medical condition. You can prevent  some of these complications by answering all of your health care provider's questions thoroughly and by following all pre-procedure instructions. General anesthesia can cause side effects, including:  Nausea or vomiting  A sore throat from the breathing tube.  Feeling cold or shivery.  Feeling tired, washed out, or achy.  Sleepiness or drowsiness.  Confusion or agitation.  What happens before the procedure? Staying hydrated Follow instructions from your health care provider about hydration, which may include:  Up to 2 hours before the procedure - you may continue to drink clear liquids, such as water, clear fruit juice, black coffee, and plain tea.  Eating and drinking restrictions Follow instructions from your health care provider about eating and drinking, which may include:  8 hours before the procedure - stop eating heavy meals or foods such as meat, fried foods, or fatty foods.  6 hours before the procedure - stop eating light meals or foods, such as toast or cereal.  6 hours before the procedure - stop drinking milk or drinks that contain milk.  2 hours before the procedure - stop drinking clear liquids.  Medicines  Ask your health care provider about: ? Changing or stopping your regular medicines. This is especially important if you are taking diabetes medicines or blood thinners. ? Taking medicines such as aspirin and ibuprofen. These medicines can thin your blood. Do not take these medicines before your procedure if your health care provider instructs you not to. ? Taking new dietary supplements or medicines. Do not take these during the week before your procedure unless your health care provider approves them.  If you are told to take a medicine or to continue taking a medicine on the day of the procedure, take the medicine with sips of water. General instructions   Ask if you will be going home the same day, the following day, or after a longer hospital  stay. ? Plan to have someone take you home. ? Plan to have someone stay with you for the first 24 hours  after you leave the hospital or clinic.  For 3-6 weeks before the procedure, try not to use any tobacco products, such as cigarettes, chewing tobacco, and e-cigarettes.  You may brush your teeth on the morning of the procedure, but make sure to spit out the toothpaste. What happens during the procedure?  You will be given anesthetics through a mask and through an IV tube in one of your veins.  You may receive medicine to help you relax (sedative).  As soon as you are asleep, a breathing tube may be used to help you breathe.  An anesthesia specialist will stay with you throughout the procedure. He or she will help keep you comfortable and safe by continuing to give you medicines and adjusting the amount of medicine that you get. He or she will also watch your blood pressure, pulse, and oxygen levels to make sure that the anesthetics do not cause any problems.  If a breathing tube was used to help you breathe, it will be removed before you wake up. The procedure may vary among health care providers and hospitals. What happens after the procedure?  You will wake up, often slowly, after the procedure is complete, usually in a recovery area.  Your blood pressure, heart rate, breathing rate, and blood oxygen level will be monitored until the medicines you were given have worn off.  You may be given medicine to help you calm down if you feel anxious or agitated.  If you will be going home the same day, your health care provider may check to make sure you can stand, drink, and urinate.  Your health care providers will treat your pain and side effects before you go home.  Do not drive for 24 hours if you received a sedative.  You may: ? Feel nauseous and vomit. ? Have a sore throat. ? Have mental slowness. ? Feel cold or shivery. ? Feel sleepy. ? Feel tired. ? Feel sore or achy, even  in parts of your body where you did not have surgery. This information is not intended to replace advice given to you by your health care provider. Make sure you discuss any questions you have with your health care provider. Document Released: 06/03/2007 Document Revised: 08/07/2015 Document Reviewed: 02/08/2015 Elsevier Interactive Patient Education  2018 [employer]. General Anesthesia, Adult, Care After These instructions provide you with information about caring for yourself after your procedure. Your health care provider may also give you more specific instructions. Your treatment has been planned according to current medical practices, but problems sometimes occur. Call your health care provider if you have any problems or questions after your procedure. What can I expect after the procedure? After the procedure, it is common to have:  Vomiting.  A sore throat.  Mental slowness.  It is common to feel:  Nauseous.  Cold or shivery.  Sleepy.  Tired.  Sore or achy, even in parts of your body where you did not have surgery.  Follow these instructions at home: For at least 24 hours after the procedure:  Do not: ? Participate in activities where you could fall or become injured. ? Drive. ? Use heavy machinery. ? Drink alcohol. ? Take sleeping pills or medicines that cause drowsiness. ? Make important decisions or sign legal documents. ? Take care of children on your own.  Rest. Eating and drinking  If you vomit, drink water, juice, or soup when you can drink without vomiting.  Drink enough fluid to keep your urine  clear or pale yellow.  Make sure you have little or no nausea before eating solid foods.  Follow the diet recommended by your health care provider. General instructions  Have a responsible adult stay with you until you are awake and alert.  Return to your normal activities as told by your health care provider. Ask your health care provider what  activities are safe for you.  Take over-the-counter and prescription medicines only as told by your health care provider.  If you smoke, do not smoke without supervision.  Keep all follow-up visits as told by your health care provider. This is important. Contact a health care provider if:  You continue to have nausea or vomiting at home, and medicines are not helpful.  You cannot drink fluids or start eating again.  You cannot urinate after 8-12 hours.  You develop a skin rash.  You have fever.  You have increasing redness at the site of your procedure. Get help right away if:  You have difficulty breathing.  You have chest pain.  You have unexpected bleeding.  You feel that you are having a life-threatening or urgent problem. This information is not intended to replace advice given to you by your health care provider. Make sure you discuss any questions you have with your health care provider. Document Released: 06/02/2000 Document Revised: 07/30/2015 Document Reviewed: 02/08/2015 Elsevier Interactive Patient Education  [employer].

## 2017-12-30 NOTE — H&P (Addendum)
PREOP CONSULT/REFERRAL INTRA-OFFICE FROM DR Gaylene Brooks         Chief Complaint  Patient presents with  . Knee Pain      surgical consult right knee      37 years old presented in August with moderate to severe right knee pain it looks like she had a knee sleeve and tramadol eventually converted to hydrocodone for ongoing pain swelling and giving way.  Eventually had an MRI which showed a torn medial meniscus.  Dr. Hilda Lias had performed prior medial meniscectomy on her back in 2016 seem to do well except did not tolerate therapy well here there is no history of trauma There are records indicating that she has had some incidents with losing her pain medication so that will be an issue going forward I have informed her that she can have medication for 2 weeks after surgery and should be okay to do home exercises         Review of Systems  Musculoskeletal: Positive for joint pain, myalgias and neck pain.  All other systems reviewed and are negative.           Past Medical History:  Diagnosis Date  . Knee pain, chronic             Past Surgical History:  Procedure Laterality Date  . ABDOMINAL HYSTERECTOMY      . DENTAL RESTORATION/EXTRACTION WITH X-RAY      . KNEE ARTHROSCOPY Right 01/25/2015    Procedure: ARTHROSCOPY KNEE;  Surgeon: Darreld Mclean, MD;  Location: [facility];  Service: Orthopedics;  Laterality: Right;  . KNEE ARTHROSCOPY WITH MEDIAL MENISECTOMY Right 01/25/2015    Procedure: KNEE ARTHROSCOPY WITH PARTIAL MEDIAL MENISECTOMY;  Surgeon: Darreld Mclean, MD;  Location: [facility];  Service: Orthopedics;  Laterality: Right;  . TUBAL LIGATION               Family History  Problem Relation Age of Onset  . Cancer Mother    . Cancer Father      Social History         Tobacco Use  . Smoking status: Current Every Day Smoker      Packs/day: 1.00      Years: 18.00      Pack years: 18.00  . Smokeless tobacco: Never Used  Substance Use Topics  . Alcohol use: No   Alcohol/week: 0.0 standard drinks      Comment: remote marjuana  . Drug use: Yes      Types: Marijuana      Comment: last use a week and half ago      No Known Allergies         Current Meds  Medication Sig  . HYDROcodone-acetaminophen (NORCO/VICODIN) 5-325 MG tablet Take 1 tablet by mouth every 4 (four) hours as needed for up to 7 days for moderate pain.  . methocarbamol (ROBAXIN) 750 MG tablet Take 750 mg by mouth 3 (three) times daily.  . sertraline (ZOLOFT) 50 MG tablet Take 50 mg by mouth daily.  . [DISCONTINUED] HYDROcodone-acetaminophen (NORCO/VICODIN) 5-325 MG tablet One tablet by mouth every six hours as needed for pain.  Seven day limit      BP 121/79   Pulse 79   Ht 6\' 2"  (1.88 m)   Wt 163 lb (73.9 kg)   LMP 05/23/2015   BMI 20.93 kg/m    Physical Exam  Constitutional: She is oriented to person, place, and time. She appears well-developed and well-nourished.  Neurological: She  is alert and oriented to person, place, and time.  Psychiatric: She has a normal mood and affect. Judgment normal.  Vitals reviewed.     Ortho Exam    Slight antalgic gait.   Left knee normal alignment no tenderness or swelling full range of motion no instability muscle tone and strength normal no tremors skin warm dry and intact without erythema neurovascular exam is intact   Right knee painful flexion tenderness over the medial compartment small effusion knee flexion close to 90 degrees no tremor muscle tone is normal no instability is noted McMurray sign was positive neurovascular exam is intact skin was warm dry and intact.   MEDICAL DECISION SECTION  Image interpretation Previous x-rays have been performed on this 14th joint space narrowing seen on the medial compartment mild valgus alignment looks normal  MRI   IMPRESSION: 1. Partial meniscectomy change of the posterior horn of the medial meniscus. A tiny wedge-shaped defect is noted along the inferior articular surface of the  remaining posterior horn that could potentially represent a new meniscal tear this is indeterminate in the absence of preoperative images for comparison. 2. Intact cruciate and collateral ligaments.     Electronically Signed   By: Tollie Eth M.D.   On: 10/30/2017 17:24  MRI was also obtained on August 23 she has a   Reports are also reviewed and also referenced back into the medical record     Encounter Diagnosis  Name Primary?  . Derangement of posterior horn of medial meniscus of right knee Yes        PLAN:    Surgical procedure planned: sark medial menisectomy    The procedure has been fully reviewed with the patient; The risks and benefits of surgery have been discussed and explained and understood. Alternative treatment has also been reviewed, questions were encouraged and answered. The postoperative plan is also been reviewed.   Nonsurgical treatment as described in the history and physical section was attempted and unsuccessful and the patient has agreed to proceed with surgical intervention to improve their situation.       Meds ordered this encounter  Medications  . HYDROcodone-acetaminophen (NORCO/VICODIN) 5-325 MG tablet      Sig: Take 1 tablet by mouth every 4 (four) hours as needed for up to 7 days for moderate pain.      Dispense:  42 tablet      Refill:  0      Fuller Canada, MD

## 2017-12-31 ENCOUNTER — Ambulatory Visit (HOSPITAL_COMMUNITY)
Admission: RE | Admit: 2017-12-31 | Discharge: 2017-12-31 | Disposition: A | Discharge status: Home / Self Care | Payer: Medicare Other | Source: Ambulatory Visit | Attending: Orthopedic Surgery | Admitting: Orthopedic Surgery

## 2017-12-31 ENCOUNTER — Encounter (HOSPITAL_COMMUNITY)
Admission: RE | Disposition: A | Discharge status: Home / Self Care | Payer: Self-pay | Source: Ambulatory Visit | Attending: Orthopedic Surgery

## 2017-12-31 ENCOUNTER — Ambulatory Visit (HOSPITAL_COMMUNITY): Payer: Medicare Other | Admitting: Anesthesiology

## 2017-12-31 ENCOUNTER — Encounter (HOSPITAL_COMMUNITY): Payer: Self-pay | Admitting: *Deleted

## 2017-12-31 DIAGNOSIS — M1990 Unspecified osteoarthritis, unspecified site: Secondary | ICD-10-CM | POA: Diagnosis not present

## 2017-12-31 DIAGNOSIS — M23221 Derangement of posterior horn of medial meniscus due to old tear or injury, right knee: Secondary | ICD-10-CM | POA: Diagnosis not present

## 2017-12-31 DIAGNOSIS — F419 Anxiety disorder, unspecified: Secondary | ICD-10-CM | POA: Insufficient documentation

## 2017-12-31 DIAGNOSIS — F17210 Nicotine dependence, cigarettes, uncomplicated: Secondary | ICD-10-CM | POA: Diagnosis not present

## 2017-12-31 DIAGNOSIS — Z79899 Other long term (current) drug therapy: Secondary | ICD-10-CM | POA: Insufficient documentation

## 2017-12-31 DIAGNOSIS — S83241D Other tear of medial meniscus, current injury, right knee, subsequent encounter: Secondary | ICD-10-CM | POA: Diagnosis not present

## 2017-12-31 DIAGNOSIS — Z9889 Other specified postprocedural states: Secondary | ICD-10-CM

## 2017-12-31 HISTORY — PX: KNEE ARTHROSCOPY WITH MEDIAL MENISECTOMY: SHX5651

## 2017-12-31 SURGERY — ARTHROSCOPY, KNEE, WITH MEDIAL MENISCECTOMY
Anesthesia: General | Laterality: Right

## 2017-12-31 MED ORDER — FENTANYL CITRATE (PF) 100 MCG/2ML IJ SOLN
INTRAMUSCULAR | Status: DC | PRN
  Administered 2017-12-31: 50 ug via INTRAVENOUS
  Administered 2017-12-31: 25 ug via INTRAVENOUS

## 2017-12-31 MED ORDER — LIDOCAINE HCL (CARDIAC) PF 50 MG/5ML IV SOSY
PREFILLED_SYRINGE | INTRAVENOUS | Status: DC | PRN
  Administered 2017-12-31: 40 mg via INTRAVENOUS

## 2017-12-31 MED ORDER — BUPIVACAINE-EPINEPHRINE (PF) 0.25% -1:200000 IJ SOLN
INTRAMUSCULAR | Status: AC
  Filled 2017-12-31: qty 60

## 2017-12-31 MED ORDER — HYDROMORPHONE HCL 1 MG/ML IJ SOLN: 0.2500 mg | INTRAMUSCULAR | Status: DC | PRN

## 2017-12-31 MED ORDER — MIDAZOLAM HCL 5 MG/5ML IJ SOLN
INTRAMUSCULAR | Status: DC | PRN
  Administered 2017-12-31: 2 mg via INTRAVENOUS

## 2017-12-31 MED ORDER — ONDANSETRON HCL 4 MG/2ML IJ SOLN
INTRAMUSCULAR | Status: AC
  Filled 2017-12-31: qty 2

## 2017-12-31 MED ORDER — HYDROCODONE-ACETAMINOPHEN 7.5-325 MG PO TABS: 1.0000 | ORAL_TABLET | Freq: Once | ORAL | Status: DC | PRN

## 2017-12-31 MED ORDER — HYDROCODONE-ACETAMINOPHEN 10-325 MG PO TABS: 1.0000 | ORAL_TABLET | ORAL | 0 refills | Status: DC | PRN

## 2017-12-31 MED ORDER — BUPIVACAINE-EPINEPHRINE (PF) 0.5% -1:200000 IJ SOLN
INTRAMUSCULAR | Status: DC | PRN
  Administered 2017-12-31: 60 mL via PERINEURAL

## 2017-12-31 MED ORDER — EPINEPHRINE PF 1 MG/ML IJ SOLN
INTRAMUSCULAR | Status: AC
  Filled 2017-12-31: qty 5

## 2017-12-31 MED ORDER — SODIUM CHLORIDE 0.9 % IR SOLN
Status: DC | PRN
  Administered 2017-12-31: 1000 mL

## 2017-12-31 MED ORDER — LACTATED RINGERS IV SOLN
INTRAVENOUS | Status: DC
  Administered 2017-12-31: 1000 mL via INTRAVENOUS

## 2017-12-31 MED ORDER — KETOROLAC TROMETHAMINE 30 MG/ML IJ SOLN
INTRAMUSCULAR | Status: AC
  Filled 2017-12-31: qty 1

## 2017-12-31 MED ORDER — SODIUM CHLORIDE 0.9 % IR SOLN
Status: DC | PRN
  Administered 2017-12-31 (×3): 3000 mL

## 2017-12-31 MED ORDER — MIDAZOLAM HCL 2 MG/2ML IJ SOLN
INTRAMUSCULAR | Status: AC
  Filled 2017-12-31: qty 2

## 2017-12-31 MED ORDER — FENTANYL CITRATE (PF) 250 MCG/5ML IJ SOLN
INTRAMUSCULAR | Status: AC
  Filled 2017-12-31: qty 5

## 2017-12-31 MED ORDER — OXYCODONE-ACETAMINOPHEN 5-325 MG PO TABS
1.0000 | ORAL_TABLET | Freq: Once | ORAL | Status: AC
  Administered 2017-12-31: 1 via ORAL

## 2017-12-31 MED ORDER — MIDAZOLAM HCL 2 MG/2ML IJ SOLN: 0.5000 mg | Freq: Once | INTRAMUSCULAR | Status: DC | PRN

## 2017-12-31 MED ORDER — CHLORHEXIDINE GLUCONATE 4 % EX LIQD: 60.0000 mL | Freq: Once | CUTANEOUS | Status: DC

## 2017-12-31 MED ORDER — PROPOFOL 10 MG/ML IV BOLUS
INTRAVENOUS | Status: DC | PRN
  Administered 2017-12-31: 50 mg via INTRAVENOUS
  Administered 2017-12-31: 160 mg via INTRAVENOUS

## 2017-12-31 MED ORDER — PROMETHAZINE HCL 25 MG/ML IJ SOLN: 6.2500 mg | INTRAMUSCULAR | Status: DC | PRN

## 2017-12-31 MED ORDER — ONDANSETRON HCL 4 MG/2ML IJ SOLN
4.0000 mg | Freq: Once | INTRAMUSCULAR | Status: AC
  Administered 2017-12-31: 4 mg via INTRAVENOUS

## 2017-12-31 MED ORDER — OXYCODONE-ACETAMINOPHEN 5-325 MG PO TABS
ORAL_TABLET | ORAL | Status: AC
  Filled 2017-12-31: qty 1

## 2017-12-31 MED ORDER — CEFAZOLIN SODIUM-DEXTROSE 2-4 GM/100ML-% IV SOLN
2.0000 g | INTRAVENOUS | Status: AC
  Administered 2017-12-31 (×2): 2 g via INTRAVENOUS
  Filled 2017-12-31: qty 100

## 2017-12-31 MED ORDER — ONDANSETRON HCL 4 MG/2ML IJ SOLN
INTRAMUSCULAR | Status: DC | PRN
  Administered 2017-12-31: 4 mg via INTRAVENOUS

## 2017-12-31 MED ORDER — KETOROLAC TROMETHAMINE 30 MG/ML IJ SOLN
30.0000 mg | Freq: Once | INTRAMUSCULAR | Status: AC
  Administered 2017-12-31: 30 mg via INTRAVENOUS

## 2017-12-31 SURGICAL SUPPLY — 46 items
BANDAGE ELASTIC 6 LF NS (GAUZE/BANDAGES/DRESSINGS) ×3 IMPLANT
BLADE SURG SZ11 CARB STEEL (BLADE) ×3 IMPLANT
CHLORAPREP W/TINT 26ML (MISCELLANEOUS) ×3 IMPLANT
CLOTH BEACON ORANGE TIMEOUT ST (SAFETY) ×3 IMPLANT
COOLER CRYO IC GRAV AND TUBE (ORTHOPEDIC SUPPLIES) ×3 IMPLANT
CUFF CRYO KNEE18X23 MED (MISCELLANEOUS) ×3 IMPLANT
CUFF TOURNIQUET SINGLE 34IN LL (TOURNIQUET CUFF) ×3 IMPLANT
CUTTER ANGLED DBL BITE 4.5 (BURR) ×3 IMPLANT
DECANTER SPIKE VIAL GLASS SM (MISCELLANEOUS) ×6 IMPLANT
GAUZE 4X4 16PLY RFD (DISPOSABLE) ×3 IMPLANT
GAUZE SPONGE 4X4 12PLY STRL (GAUZE/BANDAGES/DRESSINGS) ×3 IMPLANT
GAUZE SPONGE 4X4 16PLY XRAY LF (GAUZE/BANDAGES/DRESSINGS) ×3 IMPLANT
GAUZE XEROFORM 5X9 LF (GAUZE/BANDAGES/DRESSINGS) ×3 IMPLANT
GLOVE BIOGEL PI IND STRL 7.0 (GLOVE) ×2 IMPLANT
GLOVE BIOGEL PI INDICATOR 7.0 (GLOVE) ×4
GLOVE SKINSENSE NS SZ8.0 LF (GLOVE) ×2
GLOVE SKINSENSE STRL SZ8.0 LF (GLOVE) ×1 IMPLANT
GLOVE SS N UNI LF 8.5 STRL (GLOVE) ×3 IMPLANT
GOWN STRL REUS W/ TWL LRG LVL3 (GOWN DISPOSABLE) ×1 IMPLANT
GOWN STRL REUS W/TWL LRG LVL3 (GOWN DISPOSABLE) ×2
GOWN STRL REUS W/TWL XL LVL3 (GOWN DISPOSABLE) ×3 IMPLANT
HLDR LEG FOAM (MISCELLANEOUS) ×1 IMPLANT
IV NS IRRIG 3000ML ARTHROMATIC (IV SOLUTION) ×9 IMPLANT
KIT BLADEGUARD II DBL (SET/KITS/TRAYS/PACK) ×3 IMPLANT
KIT TURNOVER CYSTO (KITS) ×3 IMPLANT
LEG HOLDER FOAM (MISCELLANEOUS) ×2
MANIFOLD NEPTUNE II (INSTRUMENTS) ×3 IMPLANT
MARKER SKIN DUAL TIP RULER LAB (MISCELLANEOUS) ×3 IMPLANT
NEEDLE HYPO 18GX1.5 BLUNT FILL (NEEDLE) ×3 IMPLANT
NEEDLE HYPO 21X1.5 SAFETY (NEEDLE) ×3 IMPLANT
NEEDLE SPNL 18GX3.5 QUINCKE PK (NEEDLE) ×3 IMPLANT
NS IRRIG 1000ML POUR BTL (IV SOLUTION) ×3 IMPLANT
PACK ARTHRO LIMB DRAPE STRL (MISCELLANEOUS) ×3 IMPLANT
PAD ABD 5X9 TENDERSORB (GAUZE/BANDAGES/DRESSINGS) ×3 IMPLANT
PAD ARMBOARD 7.5X6 YLW CONV (MISCELLANEOUS) ×3 IMPLANT
PADDING CAST COTTON 6X4 STRL (CAST SUPPLIES) ×3 IMPLANT
PROBE BIPOLAR 50 DEGREE SUCT (MISCELLANEOUS) IMPLANT
PROBE BIPOLAR ATHRO 135MM 90D (MISCELLANEOUS) ×3 IMPLANT
SET ARTHROSCOPY INST (INSTRUMENTS) ×3 IMPLANT
SET BASIN LINEN APH (SET/KITS/TRAYS/PACK) ×3 IMPLANT
SUT ETHILON 3 0 FSL (SUTURE) ×3 IMPLANT
SYR 10ML LL (SYRINGE) ×3 IMPLANT
SYR 30ML LL (SYRINGE) ×3 IMPLANT
TUBE CONNECTING 12'X1/4 (SUCTIONS) ×2
TUBE CONNECTING 12X1/4 (SUCTIONS) ×4 IMPLANT
TUBING ARTHRO INFLOW-ONLY STRL (TUBING) ×3 IMPLANT

## 2017-12-31 NOTE — Brief Op Note (Signed)
12/31/2017  8:29 AM  PATIENT:  Courtney Patterson  37 y.o. female  PRE-OPERATIVE DIAGNOSIS:  torn medial meniscus right knee  POST-OPERATIVE DIAGNOSIS:  torn medial meniscus right knee  PROCEDURE:  Procedure(s): KNEE ARTHROSCOPY WITH MEDIAL MENISECTOMY (Right)   FINDINGS: Posterior horn body junction medial meniscal tear oblique with fraying of the posterior horn free edges.  The remaining articular surfaces and ligaments ACL and PCL were normal  SURGEON:  Surgeon(s) and Role:    * Leata Dominy E, MD - Primary  Knee arthroscopy dictation  The patient was identified in the preoperative holding area using 2 approved identification mechanisms. The chart was reviewed and updated. The surgical site was confirmed as RIGHT  knee and marked with an indelible marker.  The patient was taken to the operating room for anesthesia. After successful  GENERAL  anesthesia, ANCEF was used as IV antibiotics.  The patient was placed in the supine position with the (RIGHT LEG ) the operative extremity in an arthroscopic leg holder and the opposite extremity in a padded leg holder.  The timeout was executed.  A lateral portal was established with an 11 blade and the scope was introduced into the joint. A diagnostic arthroscopy was performed in circumferential manner examining the entire knee joint. A medial portal was established and the diagnostic arthroscopy was repeated using a probe to palpate intra-articular structures as they were encountered.     The MEDIAL  meniscus was resected using a motorized curved shaver and then balanced with a 90 degree Linvatec wand until a stable rim was obtained.   The arthroscopic pump was placed on the wash mode and any excess debris was removed from the joint using suction.  60 cc of Marcaine with epinephrine was injected through the arthroscope.  The portals were closed with 3-0 nylon suture.  A sterile bandage, Ace wrap and Cryo/Cuff was placed and the  Cryo/Cuff was activated. The patient was taken to the recovery room in stable condition.   PHYSICIAN ASSISTANT:   ASSISTANTS: none   ANESTHESIA:   general  EBL:  NONE   BLOOD ADMINISTERED:none  DRAINS: none   LOCAL MEDICATIONS USED:  MARCAINE     SPECIMEN:  No Specimen  DISPOSITION OF SPECIMEN:  N/A  COUNTS:  YES  TOURNIQUET:  * Missing tourniquet times found for documented tourniquets in log: [id number] *  DICTATION: .Dragon Dictation  PLAN OF CARE: Discharge to home after PACU  PATIENT DISPOSITION:  PACU - hemodynamically stable.   Delay start of Pharmacological VTE agent (>24hrs) due to surgical blood loss or risk of bleeding: not applicable  

## 2017-12-31 NOTE — Anesthesia Postprocedure Evaluation (Signed)
Anesthesia Post Note  Patient: Courtney Patterson  Procedure(s) Performed: KNEE ARTHROSCOPY WITH MEDIAL MENISECTOMY (Right )  Patient location during evaluation: PACU Anesthesia Type: General Level of consciousness: awake and alert and oriented Pain management: pain level controlled Vital Signs Assessment: post-procedure vital signs reviewed and stable Respiratory status: spontaneous breathing Cardiovascular status: blood pressure returned to baseline and stable Postop Assessment: no apparent nausea or vomiting Anesthetic complications: no     Last Vitals:  Vitals:   12/31/17 0900 12/31/17 0915  BP: 110/75 110/70  Pulse: 73 69  Resp: 12 11  Temp:    SpO2: 98% 99%    Last Pain:  Vitals:   12/31/17 0840  TempSrc:   PainSc: 3                  Esteven Overfelt

## 2017-12-31 NOTE — Anesthesia Preprocedure Evaluation (Signed)
Anesthesia Evaluation  Patient identified by MRN, date of birth, ID band Patient awake    Reviewed: Allergy & Precautions, NPO status , Patient's Chart, lab work & pertinent test results  Airway Mallampati: I  TM Distance: >3 FB Neck ROM: Full    Dental no notable dental hx. (+) Edentulous Upper, Missing   Pulmonary neg pulmonary ROS, Current Smoker,  Smoked today 1/2 ppd smoker   Pulmonary exam normal breath sounds clear to auscultation       Cardiovascular Exercise Tolerance: Good negative cardio ROS Normal cardiovascular examI Rhythm:Regular Rate:Normal     Neuro/Psych Anxiety negative neurological ROS  negative psych ROS   GI/Hepatic negative GI ROS, Neg liver ROS,   Endo/Other  negative endocrine ROS  Renal/GU negative Renal ROS  negative genitourinary   Musculoskeletal  (+) Arthritis , Osteoarthritis,    Abdominal   Peds negative pediatric ROS (+)  Hematology negative hematology ROS (+)   Anesthesia Other Findings   Reproductive/Obstetrics negative OB ROS                             Anesthesia Physical Anesthesia Plan  ASA: II  Anesthesia Plan: General   Post-op Pain Management:    Induction: Intravenous  PONV Risk Score and Plan:   Airway Management Planned: LMA  Additional Equipment:   Intra-op Plan:   Post-operative Plan: Extubation in OR  Informed Consent: I have reviewed the patients History and Physical, chart, labs and discussed the procedure including the risks, benefits and alternatives for the proposed anesthesia with the patient or authorized representative who has indicated his/her understanding and acceptance.   Dental advisory given  Plan Discussed with: CRNA  Anesthesia Plan Comments:         Anesthesia Quick Evaluation

## 2017-12-31 NOTE — Progress Notes (Signed)
Please excuse Courtney Patterson from school on 10/24-10/25/2019.  Her mother had a surgical procedure at [facility] and needs post-operative assistance at home.  Virgie Dad RN  [facility]

## 2017-12-31 NOTE — Op Note (Signed)
12/31/2017  8:29 AM  PATIENT:  Courtney Patterson  37 y.o. female  PRE-OPERATIVE DIAGNOSIS:  torn medial meniscus right knee  POST-OPERATIVE DIAGNOSIS:  torn medial meniscus right knee  PROCEDURE:  Procedure(s): KNEE ARTHROSCOPY WITH MEDIAL MENISECTOMY (Right)   FINDINGS: Posterior horn body junction medial meniscal tear oblique with fraying of the posterior horn free edges.  The remaining articular surfaces and ligaments ACL and PCL were normal  SURGEON:  Surgeon(s) and Role:    Vickki Hearing, MD - Primary  Knee arthroscopy dictation  The patient was identified in the preoperative holding area using 2 approved identification mechanisms. The chart was reviewed and updated. The surgical site was confirmed as RIGHT  knee and marked with an indelible marker.  The patient was taken to the operating room for anesthesia. After successful  GENERAL  anesthesia, ANCEF was used as IV antibiotics.  The patient was placed in the supine position with the (RIGHT LEG ) the operative extremity in an arthroscopic leg holder and the opposite extremity in a padded leg holder.  The timeout was executed.  A lateral portal was established with an 11 blade and the scope was introduced into the joint. A diagnostic arthroscopy was performed in circumferential manner examining the entire knee joint. A medial portal was established and the diagnostic arthroscopy was repeated using a probe to palpate intra-articular structures as they were encountered.     The MEDIAL  meniscus was resected using a motorized curved shaver and then balanced with a 90 degree Linvatec wand until a stable rim was obtained.   The arthroscopic pump was placed on the wash mode and any excess debris was removed from the joint using suction.  60 cc of Marcaine with epinephrine was injected through the arthroscope.  The portals were closed with 3-0 nylon suture.  A sterile bandage, Ace wrap and Cryo/Cuff was placed and the  Cryo/Cuff was activated. The patient was taken to the recovery room in stable condition.   PHYSICIAN ASSISTANT:   ASSISTANTS: none   ANESTHESIA:   general  EBL:  NONE   BLOOD ADMINISTERED:none  DRAINS: none   LOCAL MEDICATIONS USED:  MARCAINE     SPECIMEN:  No Specimen  DISPOSITION OF SPECIMEN:  N/A  COUNTS:  YES  TOURNIQUET:  * Missing tourniquet times found for documented tourniquets in log: [id number] *  DICTATION: .Dragon Dictation  PLAN OF CARE: Discharge to home after PACU  PATIENT DISPOSITION:  PACU - hemodynamically stable.   Delay start of Pharmacological VTE agent (>24hrs) due to surgical blood loss or risk of bleeding: not applicable

## 2017-12-31 NOTE — Transfer of Care (Signed)
Immediate Anesthesia Transfer of Care Note  Patient: Courtney Patterson  Procedure(s) Performed: KNEE ARTHROSCOPY WITH MEDIAL MENISECTOMY (Right )  Patient Location: PACU  Anesthesia Type:General  Level of Consciousness: awake  Airway & Oxygen Therapy: Patient Spontanous Breathing  Post-op Assessment: Report given to RN  Post vital signs: Reviewed  Last Vitals:  Vitals Value Taken Time  BP 105/72 12/31/2017  8:38 AM  Temp    Pulse 73 12/31/2017  8:41 AM  Resp 8 12/31/2017  8:41 AM  SpO2 99 % 12/31/2017  8:41 AM  Vitals shown include unvalidated device data.  Last Pain:  Vitals:   12/31/17 0646  TempSrc: Oral  PainSc: 0-No pain      Patients Stated Pain Goal: 7 (12/31/17 8295)  Complications: No apparent anesthesia complications

## 2017-12-31 NOTE — Anesthesia Procedure Notes (Signed)
Procedure Name: LMA Insertion Date/Time: 12/31/2017 11:36 AM Performed by: Moshe Salisbury, CRNA Pre-anesthesia Checklist: Patient identified, Patient being monitored, Emergency Drugs available, Timeout performed and Suction available Patient Re-evaluated:Patient Re-evaluated prior to induction Oxygen Delivery Method: Circle System Utilized Preoxygenation: Pre-oxygenation with 100% oxygen Induction Type: IV induction Ventilation: Mask ventilation without difficulty LMA: LMA inserted LMA Size: 4.0 Number of attempts: 1 Placement Confirmation: positive ETCO2 and breath sounds checked- equal and bilateral

## 2017-12-31 NOTE — Discharge Instructions (Signed)
The surgery was very successful. You  had a slight tear in your medial meniscus which was resected.    You still have 80% of your meniscus left.    The other parts of your knee were in excellent condition with no ligament tears or arthritic changes seen      General Anesthesia, Adult, Care After These instructions provide you with information about caring for yourself after your procedure. Your health care provider may also give you more specific instructions. Your treatment has been planned according to current medical practices, but problems sometimes occur. Call your health care provider if you have any problems or questions after your procedure. What can I expect after the procedure? After the procedure, it is common to have:  Vomiting.  A sore throat.  Mental slowness.  It is common to feel:  Nauseous.  Cold or shivery.  Sleepy.  Tired.  Sore or achy, even in parts of your body where you did not have surgery.  Follow these instructions at home: For at least 24 hours after the procedure:  Do not: ? Participate in activities where you could fall or become injured. ? Drive. ? Use heavy machinery. ? Drink alcohol. ? Take sleeping pills or medicines that cause drowsiness. ? Make important decisions or sign legal documents. ? Take care of children on your own.  Rest. Eating and drinking  If you vomit, drink water, juice, or soup when you can drink without vomiting.  Drink enough fluid to keep your urine clear or pale yellow.  Make sure you have little or no nausea before eating solid foods.  Follow the diet recommended by your health care provider. General instructions  Have a responsible adult stay with you until you are awake and alert.  Return to your normal activities as told by your health care provider. Ask your health care provider what activities are safe for you.  Take over-the-counter and prescription medicines only as told by your health care  provider.  If you smoke, do not smoke without supervision.  Keep all follow-up visits as told by your health care provider. This is important. Contact a health care provider if:  You continue to have nausea or vomiting at home, and medicines are not helpful.  You cannot drink fluids or start eating again.  You cannot urinate after 8-12 hours.  You develop a skin rash.  You have fever.  You have increasing redness at the site of your procedure. Get help right away if:  You have difficulty breathing.  You have chest pain.  You have unexpected bleeding.  You feel that you are having a life-threatening or urgent problem. This information is not intended to replace advice given to you by your health care provider. Make sure you discuss any questions you have with your health care provider. Document Released: 06/02/2000 Document Revised: 07/30/2015 Document Reviewed: 02/08/2015 Elsevier Interactive Patient Education  [employer].

## 2017-12-31 NOTE — Interval H&P Note (Signed)
History and Physical Interval Note:  12/31/2017 7:25 AM  Courtney Patterson  has presented today for surgery, with the diagnosis of torn medial meniscus right knee  The various methods of treatment have been discussed with the patient and family. After consideration of risks, benefits and other options for treatment, the patient has consented to  Procedure(s): KNEE ARTHROSCOPY WITH MEDIAL MENISECTOMY (Right) as a surgical intervention .  The patient's history has been reviewed, patient examined, no change in status, stable for surgery.  I have reviewed the patient's chart and labs.  Questions were answered to the patient's satisfaction.     Fuller Canada

## 2018-01-01 ENCOUNTER — Encounter (HOSPITAL_COMMUNITY): Payer: Self-pay | Admitting: Orthopedic Surgery

## 2018-01-08 ENCOUNTER — Ambulatory Visit: Payer: Medicare Other | Admitting: Orthopedic Surgery

## 2018-01-14 ENCOUNTER — Encounter: Payer: Self-pay | Admitting: Orthopedic Surgery

## 2018-01-15 ENCOUNTER — Encounter: Payer: Self-pay | Admitting: Orthopedic Surgery

## 2018-01-15 ENCOUNTER — Ambulatory Visit (INDEPENDENT_AMBULATORY_CARE_PROVIDER_SITE_OTHER): Payer: Medicare Other | Admitting: Orthopedic Surgery

## 2018-01-15 VITALS — BP 123/57 | HR 74 | Ht 74.0 in | Wt 168.0 lb

## 2018-01-15 DIAGNOSIS — Z9889 Other specified postprocedural states: Secondary | ICD-10-CM

## 2018-01-15 MED ORDER — HYDROCODONE-ACETAMINOPHEN 7.5-325 MG PO TABS: 1.0000 | ORAL_TABLET | ORAL | 0 refills | Status: DC | PRN

## 2018-01-15 MED ORDER — HYDROCODONE-ACETAMINOPHEN 7.5-325 MG PO TABS: 1.0000 | ORAL_TABLET | ORAL | 0 refills | Status: AC | PRN

## 2018-01-15 NOTE — Progress Notes (Signed)
Chief Complaint  Patient presents with  . Knee Pain    Right knee DOS 12/31/17    POST OP VISIT for 1, postop day #14  She is regained full range of motion she has no swelling in the knee we took the sutures out.  As agreed to prior to surgery she is due for 1 more prescription which was filled  Follow-up as needed  12/31/2017  8:29 AM  PATIENT:  Courtney Patterson  37 y.o. female  PRE-OPERATIVE DIAGNOSIS:  torn medial meniscus right knee  POST-OPERATIVE DIAGNOSIS:  torn medial meniscus right knee  PROCEDURE:  Procedure(s): KNEE ARTHROSCOPY WITH MEDIAL MENISECTOMY (Right)   FINDINGS: Posterior horn body junction medial meniscal tear oblique with fraying of the posterior horn free edges.  The remaining articular surfaces and ligaments ACL and PCL were normal  SURGEON:  Surgeon(s) and Role:    Vickki Hearing, MD - Primary

## 2018-01-25 ENCOUNTER — Telehealth: Payer: Self-pay | Admitting: Orthopedic Surgery

## 2018-01-25 NOTE — Telephone Encounter (Signed)
No we cant do that   Please do not make an appt unless something has changed   Tylenol and advil are appropriate for this far out from surgery

## 2018-01-25 NOTE — Telephone Encounter (Signed)
Patient called to ask whether Dr Romeo AppleHarrison would refill pain medication "one more time" (Hydrocodone Acetaminophen)?  She has been released from her knee surgery as of 01/15/18. If so, pharmacy is [facility].

## 2018-01-26 NOTE — Telephone Encounter (Signed)
I have called her to advise. She has voiced understanding.

## 2019-04-01 ENCOUNTER — Encounter (HOSPITAL_COMMUNITY): Payer: Self-pay

## 2019-04-01 ENCOUNTER — Emergency Department (HOSPITAL_COMMUNITY)
Admission: EM | Admit: 2019-04-01 | Discharge: 2019-04-01 | Disposition: A | Discharge status: Home / Self Care | Payer: Medicare Other | Attending: Emergency Medicine | Admitting: Emergency Medicine

## 2019-04-01 ENCOUNTER — Other Ambulatory Visit: Payer: Self-pay

## 2019-04-01 DIAGNOSIS — F1210 Cannabis abuse, uncomplicated: Secondary | ICD-10-CM | POA: Diagnosis not present

## 2019-04-01 DIAGNOSIS — Z79899 Other long term (current) drug therapy: Secondary | ICD-10-CM | POA: Insufficient documentation

## 2019-04-01 DIAGNOSIS — F17210 Nicotine dependence, cigarettes, uncomplicated: Secondary | ICD-10-CM | POA: Insufficient documentation

## 2019-04-01 DIAGNOSIS — M545 Low back pain, unspecified: Secondary | ICD-10-CM

## 2019-04-01 MED ORDER — OXYCODONE-ACETAMINOPHEN 5-325 MG PO TABS
1.0000 | ORAL_TABLET | Freq: Once | ORAL | Status: AC
  Administered 2019-04-01: 14:00:00 1 via ORAL
  Filled 2019-04-01: qty 1

## 2019-04-01 MED ORDER — METHOCARBAMOL 500 MG PO TABS: 500.0000 mg | ORAL_TABLET | Freq: Two times a day (BID) | ORAL | 0 refills | Status: DC

## 2019-04-01 MED ORDER — NAPROXEN 500 MG PO TABS: 500.0000 mg | ORAL_TABLET | Freq: Two times a day (BID) | ORAL | 0 refills | Status: DC

## 2019-04-01 MED ORDER — METHOCARBAMOL 500 MG PO TABS
500.0000 mg | ORAL_TABLET | Freq: Once | ORAL | Status: AC
  Administered 2019-04-01: 14:00:00 500 mg via ORAL
  Filled 2019-04-01: qty 1

## 2019-04-01 MED ORDER — KETOROLAC TROMETHAMINE 30 MG/ML IJ SOLN
30.0000 mg | Freq: Once | INTRAMUSCULAR | Status: AC
  Administered 2019-04-01: 14:00:00 30 mg via INTRAMUSCULAR
  Filled 2019-04-01: qty 1

## 2019-04-01 MED ORDER — LIDOCAINE 5 % EX PTCH
1.0000 | MEDICATED_PATCH | CUTANEOUS | Status: DC
  Administered 2019-04-01: 1 via TRANSDERMAL
  Filled 2019-04-01: qty 1

## 2019-04-01 NOTE — ED Provider Notes (Signed)
[facility] EMERGENCY DEPARTMENT Provider Note   CSN: [id number] Arrival date & time: 04/01/19  1340     History Chief Complaint  Patient presents with  . Back Pain    Courtney Patterson is a 39 y.o. female.  Courtney Patterson is a 39 y.o. female with a history of anxiety, depression, and arthritis, who presents to the ED for evaluation of low back pain.  She reports this episode of pain started about 3 days ago and has been constant starting in the middle of her low back and radiating out across both sides.  Pain does not radiate into the legs.  She denies any associated numbness weakness or tingling.  No loss of bowel or bladder control or saddle anesthesia.  She has been able to walk without difficulty but does state it is uncomfortable.  She has been taking ibuprofen and tramadol without improvement.  Had a telemedicine visit with her PCP yesterday.  She denies any associated fevers or chills, no associated abdominal pain or urinary symptoms.  She reports she does a lot of forward bending, but denies focal injury to her back.  States a few years ago she fell landing on her back and ever since then she has had intermittent flares of back pain that feels similar.  She denies any history of cancer or IV drug use.        Past Medical History:  Diagnosis Date  . Anxiety   . Arthritis   . Depression   . Knee pain, chronic     Patient Active Problem List   Diagnosis Date Noted  . S/P arthroscopy of right knee 12/31/17     Past Surgical History:  Procedure Laterality Date  . ABDOMINAL HYSTERECTOMY    . DENTAL RESTORATION/EXTRACTION WITH X-RAY    . KNEE ARTHROSCOPY Right 01/25/2015   Procedure: ARTHROSCOPY KNEE;  Surgeon: Sanjuana Kava, MD;  Location: [facility];  Service: Orthopedics;  Laterality: Right;  . KNEE ARTHROSCOPY WITH MEDIAL MENISECTOMY Right 01/25/2015   Procedure: KNEE ARTHROSCOPY WITH PARTIAL MEDIAL MENISECTOMY;  Surgeon: Sanjuana Kava, MD;  Location: [facility];  Service:  Orthopedics;  Laterality: Right;  . KNEE ARTHROSCOPY WITH MEDIAL MENISECTOMY Right 12/31/2017   Procedure: KNEE ARTHROSCOPY WITH MEDIAL MENISECTOMY;  Surgeon: Carole Civil, MD;  Location: [facility];  Service: Orthopedics;  Laterality: Right;  . TUBAL LIGATION       OB History   No obstetric history on file.     Family History  Problem Relation Age of Onset  . Cancer Mother   . Cancer Father     Social History   Tobacco Use  . Smoking status: Current Every Day Smoker    Packs/day: 0.50    Years: 18.00    Pack years: 9.00  . Smokeless tobacco: Never Used  Substance Use Topics  . Alcohol use: No    Alcohol/week: 0.0 standard drinks    Comment: remote marjuana  . Drug use: Yes    Types: Marijuana    Comment: last used 03/31/2019    Home Medications Prior to Admission medications   Medication Sig Start Date End Date Taking? Authorizing Provider  diclofenac (VOLTAREN) 75 MG EC tablet Take 75 mg by mouth 2 (two) times daily.    [provider]  methocarbamol (ROBAXIN) 500 MG tablet Take 1 tablet (500 mg total) by mouth 2 (two) times daily. 04/01/19   Jacqlyn Larsen, PA-C  naproxen (NAPROSYN) 500 MG tablet Take 1 tablet (500 mg total)  by mouth 2 (two) times daily. 04/01/19   Dartha Lodge, PA-C  sertraline (ZOLOFT) 50 MG tablet Take 50 mg by mouth daily. 08/26/17   [provider]    Allergies    Patient has no known allergies.  Review of Systems   Review of Systems  Constitutional: Negative for chills and fever.  HENT: Negative.   Respiratory: Negative for shortness of breath.   Cardiovascular: Negative for chest pain.  Gastrointestinal: Negative for abdominal pain, constipation, diarrhea, nausea and vomiting.  Genitourinary: Negative for dysuria, flank pain, frequency and hematuria.  Musculoskeletal: Positive for back pain. Negative for arthralgias, gait problem, joint swelling, myalgias and neck pain.  Skin: Negative for color change, rash and  wound.  Neurological: Negative for weakness and numbness.    Physical Exam Updated Vital Signs BP 134/78 (BP Location: Right Arm)   Pulse 82   Temp 97.9 F (36.6 C) (Oral)   Resp 18   Ht 6\' 2"  (1.88 m)   Wt 78.9 kg   LMP 05/23/2015   SpO2 100%   BMI 22.34 kg/m   Physical Exam Vitals and nursing note reviewed.  Constitutional:      General: She is not in acute distress.    Appearance: She is well-developed. She is not diaphoretic.  HENT:     Head: Atraumatic.  Eyes:     General:        Right eye: No discharge.        Left eye: No discharge.  Cardiovascular:     Pulses:          Radial pulses are 2+ on the right side and 2+ on the left side.       Dorsalis pedis pulses are 2+ on the right side and 2+ on the left side.       Posterior tibial pulses are 2+ on the right side and 2+ on the left side.  Pulmonary:     Effort: Pulmonary effort is normal. No respiratory distress.  Abdominal:     General: Bowel sounds are normal. There is no distension.     Palpations: Abdomen is soft. There is no mass.     Tenderness: There is no abdominal tenderness. There is no guarding.     Comments: Abdomen soft, nondistended, nontender to palpation in all quadrants without guarding or peritoneal signs, no CVA tenderness bilaterally  Musculoskeletal:     Cervical back: Neck supple.     Comments: Tenderness to palpation over across the low back.  Pain made worse with range of motion of the lower extremities, negative straight leg raise bilaterally  Skin:    General: Skin is warm and dry.     Capillary Refill: Capillary refill takes less than 2 seconds.  Neurological:     Mental Status: She is alert and oriented to person, place, and time.     Comments: Alert, clear speech, following commands. Moving all extremities without difficulty. Bilateral lower extremities with 5/5 strength in proximal and distal muscle groups and with dorsi and plantar flexion. Sensation intact in bilateral lower  extremities. 2+ patellar DTRs bilaterally. Ambulatory with steady gait  Psychiatric:        Mood and Affect: Mood normal.        Behavior: Behavior normal.     ED Results / Procedures / Treatments   Labs (all labs ordered are listed, but only abnormal results are displayed) Labs Reviewed - No data to display  EKG None  Radiology No results  found.  Procedures Procedures (including critical care time)  Medications Ordered in ED Medications  lidocaine (LIDODERM) 5 % 1 patch (1 patch Transdermal Patch Applied 04/01/19 1409)  oxyCODONE-acetaminophen (PERCOCET/ROXICET) 5-325 MG per tablet 1 tablet (1 tablet Oral Given 04/01/19 1407)  ketorolac (TORADOL) 30 MG/ML injection 30 mg (30 mg Intramuscular Given 04/01/19 1408)  methocarbamol (ROBAXIN) tablet 500 mg (500 mg Oral Given 04/01/19 1407)    ED Course  I have reviewed the triage vital signs and the nursing notes.  Pertinent labs & imaging results that were available during my care of the patient were reviewed by me and considered in my medical decision making (see chart for details).    MDM Rules/Calculators/A&P                      Patient with back pain.  No neurological deficits and normal neuro exam.  Patient can walk but states is painful.  No loss of bowel or bladder control.  No concern for cauda equina.  No fever, night sweats, weight loss, h/o cancer, IVDU.  RICE protocol and pain medicine indicated and discussed with patient.  Patient just had a tramadol prescription filled on review of [employer] narcotic database, so we will avoid prescribing any additional narcotics but will prescribe anti-inflammatories and muscle relaxer and encourage lidocaine patches.  Final Clinical Impression(s) / ED Diagnoses Final diagnoses:  Acute midline low back pain without sciatica    Rx / DC Orders ED Discharge Orders         Ordered    methocarbamol (ROBAXIN) 500 MG tablet  2 times daily     04/01/19 1433    naproxen (NAPROSYN) 500 MG  tablet  2 times daily     04/01/19 1433           Dartha Lodge, [place] 04/01/19 1435    Maia Plan, MD 04/01/19 1759

## 2019-04-01 NOTE — Discharge Instructions (Addendum)
You were seen here today for Back Pain: Low back pain is discomfort in the lower back that may be due to injuries to muscles and ligaments around the spine. Occasionally, it may be caused by a problem to a part of the spine called a disc. Your back pain should be treated with medicines listed below as well as back exercises and this back pain should get better over the next 2 weeks. Most patients get completely well in 4 weeks. It is important to know however, if you develop severe or worsening pain, low back pain with fever, numbness, weakness or inability to walk or urinate, you should return to the ER immediately.  Please follow up with your doctor this week for a recheck if still having symptoms.  HOME INSTRUCTIONS Self - care:  The application of heat can help soothe the pain.  Maintaining your daily activities, including walking (this is encouraged), as it will help you get better faster than just staying in bed. Do not life, push, pull anything more than 10 pounds for the next week. I am attaching back exercises that you can do at home to help facilitate your recovery.   Back Exercises - I have attached a handout on back exercises that can be done at home to help facilitate your recovery.   Medications are also useful to help with pain control.   Acetaminophen.  This medication is generally safe, and found over the counter. Take as directed for your age. You should not take more than 8 of the extra strength (500mg ) pills a day (max dose is 4000mg  total OVER one day)  Non steroidal anti inflammatory: This includes medications including Ibuprofen, naproxen and Mobic; These medications help both pain and swelling and are very useful in treating back pain.  They should be taken with food, as they can cause stomach upset, and more seriously, stomach bleeding. Do not combine the medications.   Lidocaine Patch: Salon Pas lidocaine patches (blue and silver box) can be purchased over the counter and worn  for 12 hours for local pain relief   Muscle relaxants:  These medications can help with muscle tightness that is a cause of lower back pain.  Most of these medications can cause drowsiness, and it is not safe to drive or use dangerous machinery while taking them. They are primarily helpful when taken at night before sleep.  You will need to follow up with your primary healthcare provider or the Orthopedist in 1-2 weeks for reassessment and persistent symptoms.  Be aware that if you develop new symptoms, such as a fever, leg weakness, difficulty with or loss of control of your urine or bowels, abdominal pain, or more severe pain, you will need to seek medical attention and/or return to the Emergency department. Additional Information:  Your vital signs today were: BP 134/78 (BP Location: Right Arm)   Pulse 82   Temp 97.9 F (36.6 C) (Oral)   Resp 18   Ht 6\' 2"  (1.88 m)   Wt 78.9 kg   LMP 05/23/2015   SpO2 100%   BMI 22.34 kg/m  If your blood pressure (BP) was elevated above 135/85 this visit, please have this repeated by your doctor within one month. ---------------

## 2019-04-01 NOTE — ED Triage Notes (Signed)
Pt presents to ED with complaints of lower back pain x 2-3 days. Pt denies numbness, tingling or problems with bowel or bladder function. Pt denies injury.

## 2019-05-16 IMAGING — MR MR KNEE*R* W/O CM
4 of 6 series · 19 of 40 positions shown · non-contrast
Comparison: None.

CLINICAL DATA: Right knee pain x1 month without known trauma.
Partial medial meniscectomy 01/25/2015

EXAM:
MRI OF THE RIGHT KNEE WITHOUT CONTRAST
TECHNIQUE: Multiplanar, multisequence MR imaging of the knee was performed. No
intravenous contrast was administered.

[Series 3: t2fs axial · axial · 4.0mm · 0.45mm/px · z∈[-44,+71]mm · 5 of 24 slices shown]
[im 1/24]
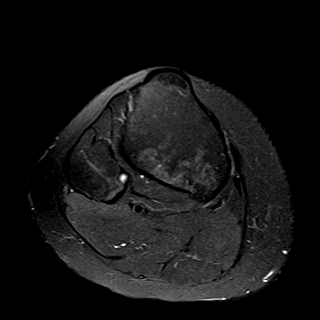
[im 6/24]
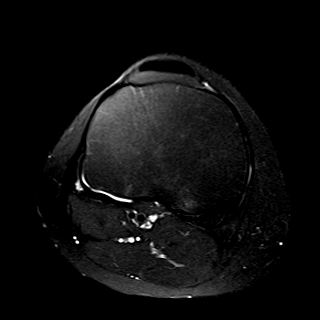
[im 12/24]
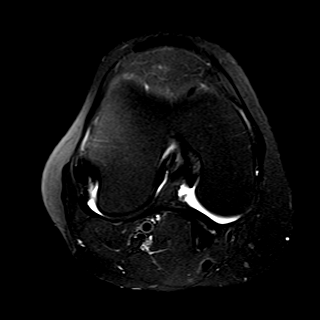
[im 18/24]
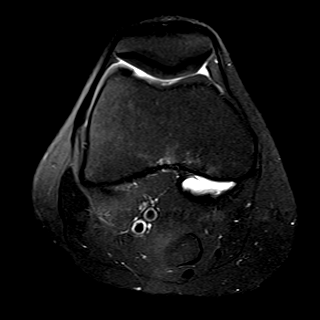
[im 24/24]
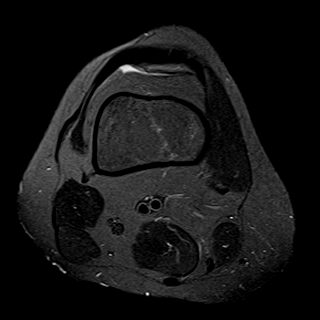

[Series 4: T1 · coronal · 4.0mm · 0.31mm/px · 7 of 27 slices shown]
[im 1/27]
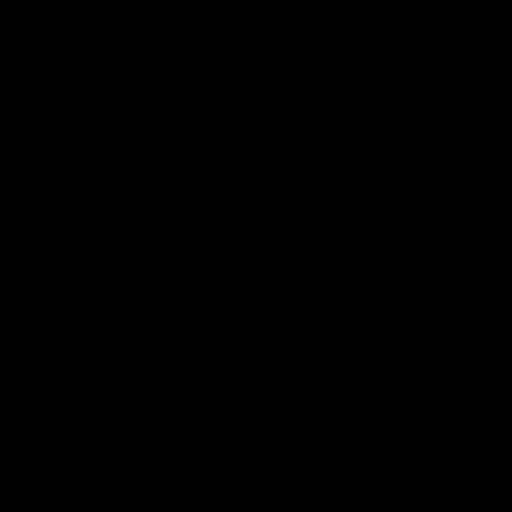
[im 5/27]
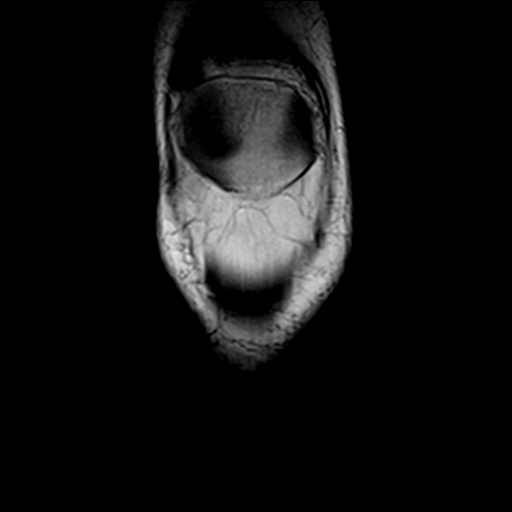
[im 9/27]
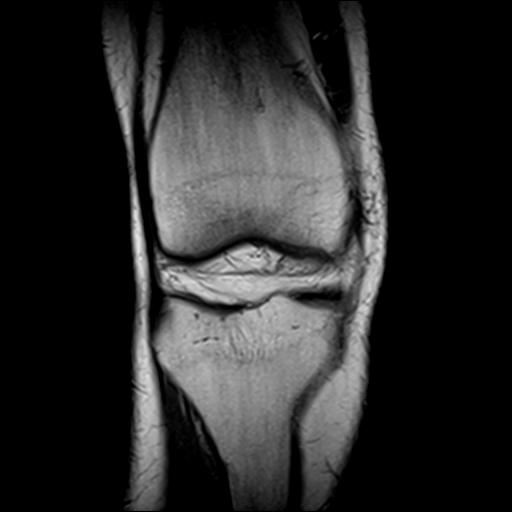
[im 14/27]
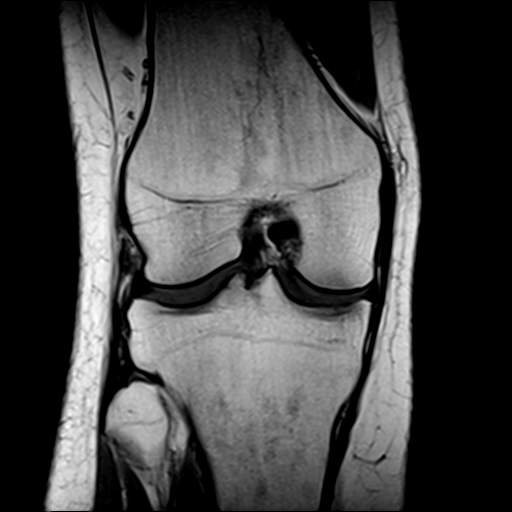
[im 18/27]
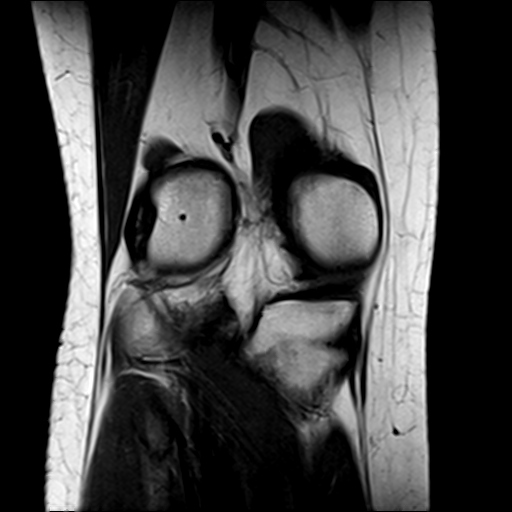
[im 22/27]
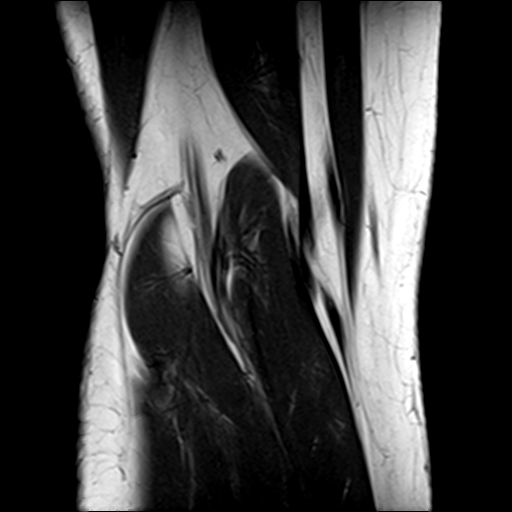
[im 27/27]
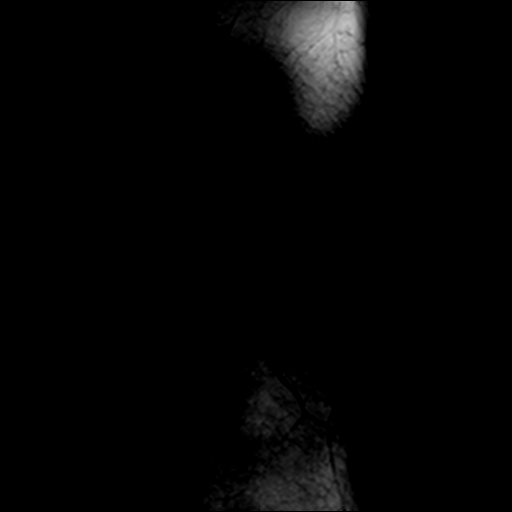

[Series 5: pdfs sag · sagittal · 3.0mm · 0.23mm/px · 4 of 29 slices shown]
[im 1/29]
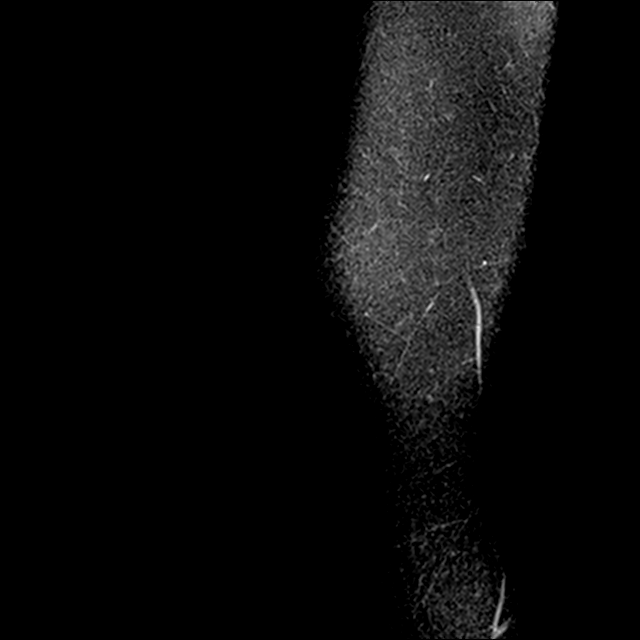
[im 5/29]
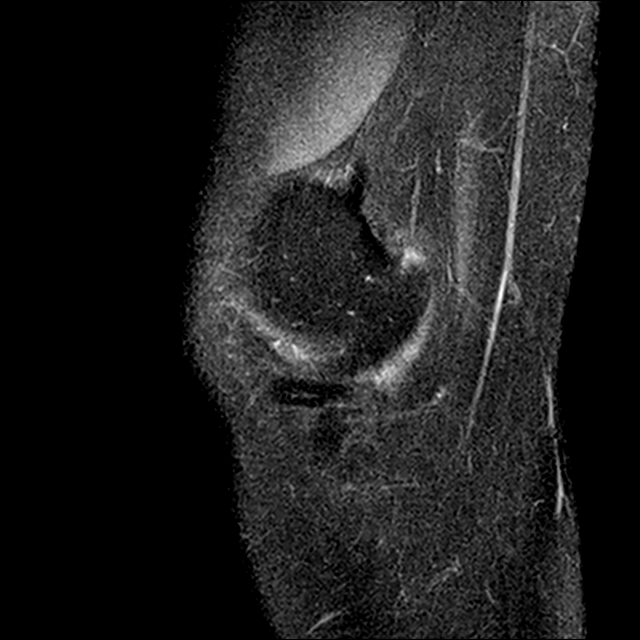
[im 15/29]
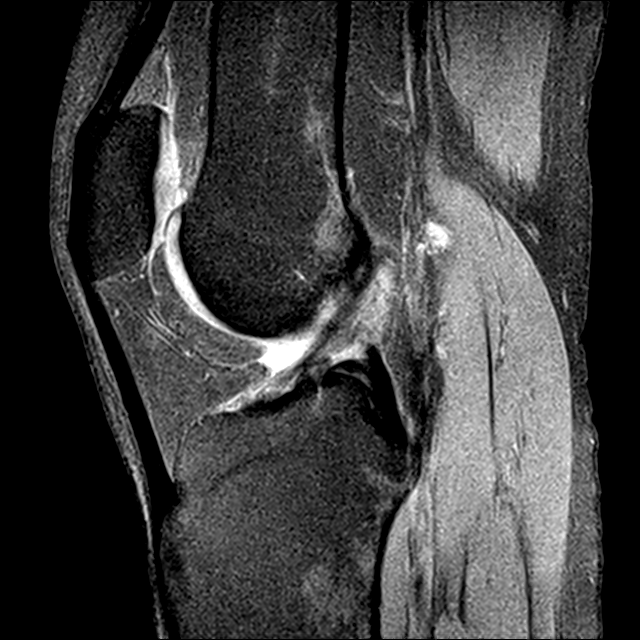
[im 24/29]
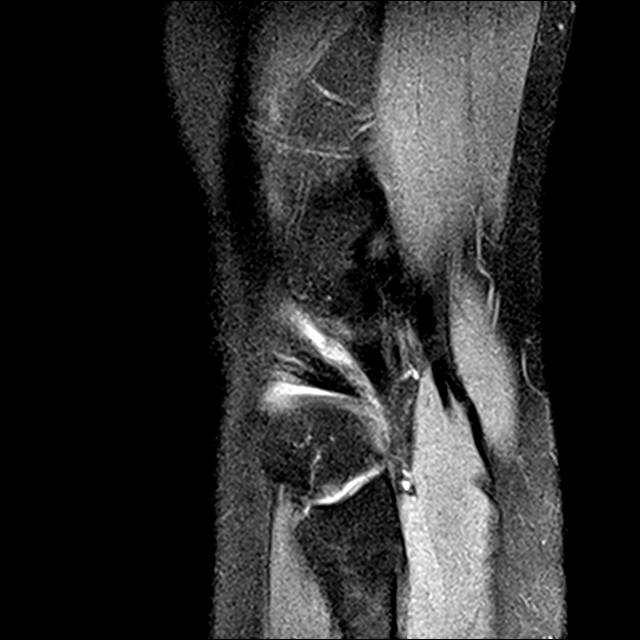

[Series 6: t2fs cor · coronal · 4.0mm · 0.31mm/px · 3 of 27 slices shown]
[im 5/27]
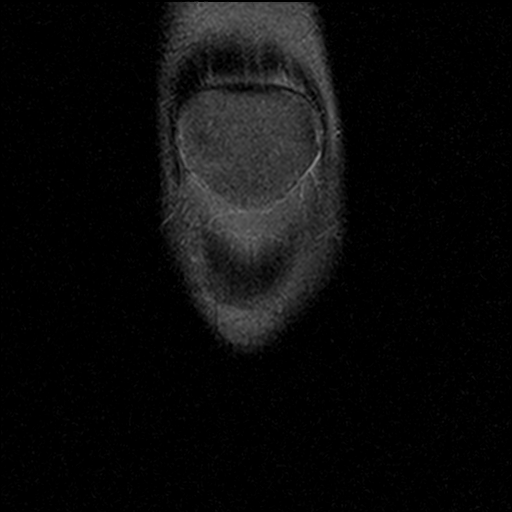
[im 14/27]
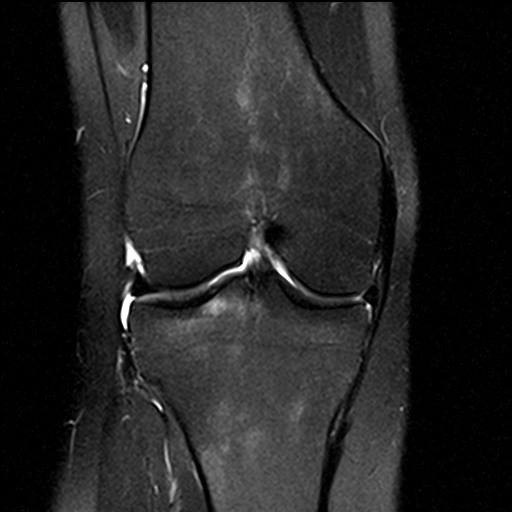
[im 22/27]
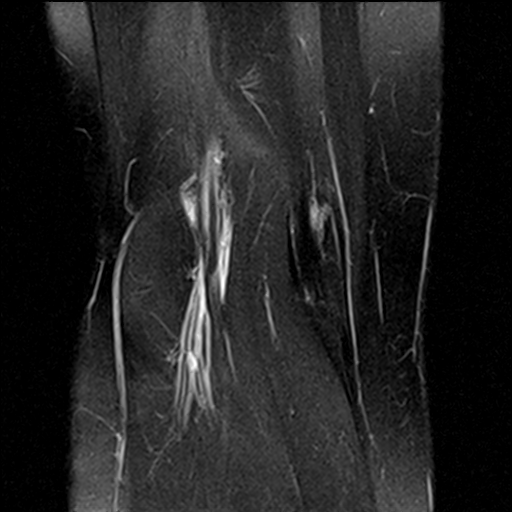

[19 of 40 positions shown; findings below may reference images not displayed]

FINDINGS: MENISCI

Medial meniscus: Partial meniscectomy change of the posterior horn
without definite recurrence. Small wedge-shaped deformity along the
inferior articular surface of the medial meniscus is seen, series
[DATE]. It is uncertain whether this represents a new tear of the
remaining posterior horn in the absence of prior comparisons. The
remainder of the meniscus appears intact.

Lateral meniscus:  Intact

LIGAMENTS

Cruciates:  Intact ACL and PCL.

Collaterals: Intact

CARTILAGE

Patellofemoral:  No focal chondral defect.

Medial: Mild partial-thickness cartilage loss of the medial
femorotibial compartment.

Lateral:  Intact

Joint:  No joint effusion.  Normal Hoffa's fat pad.

Popliteal Fossa:  Tiny popliteal cyst.  Intact popliteus.

Extensor Mechanism:  Intact

Bones: No acute osseous abnormality or suspicious osseous lesions.
Minimal non-specific heterogeneity of the bone marrow.

Other: None
IMPRESSION: 1. Partial meniscectomy change of the posterior horn of the medial
meniscus. A tiny wedge-shaped defect is noted along the inferior
articular surface of the remaining posterior horn that could
potentially represent a new meniscal tear this is indeterminate in
the absence of preoperative images for comparison.
2. Intact cruciate and collateral ligaments.

## 2019-06-14 ENCOUNTER — Ambulatory Visit (INDEPENDENT_AMBULATORY_CARE_PROVIDER_SITE_OTHER): Payer: Medicare Other | Admitting: Orthopaedic Surgery

## 2019-06-14 ENCOUNTER — Other Ambulatory Visit: Payer: Self-pay

## 2019-06-14 ENCOUNTER — Encounter: Payer: Self-pay | Admitting: Orthopaedic Surgery

## 2019-06-14 VITALS — BP 112/77 | HR 69 | Ht 74.0 in | Wt 173.0 lb

## 2019-06-14 DIAGNOSIS — M79605 Pain in left leg: Secondary | ICD-10-CM

## 2019-06-14 DIAGNOSIS — M545 Low back pain, unspecified: Secondary | ICD-10-CM

## 2019-06-14 MED ORDER — HYDROCODONE-ACETAMINOPHEN 5-325 MG PO TABS: ORAL_TABLET | ORAL | 0 refills | Status: DC

## 2019-06-14 NOTE — Progress Notes (Signed)
Patient ZD:GLOVF Courtney Patterson, female DOB:03-07-1981, 39 y.o. IEP:[id number]  Chief Complaint  Patient presents with  . Back Pain    history of old injury / new pain     HPI  Courtney Patterson is a 39 y.o. female who has lower back pain.  She has been seeing a doctor in [place].  She had x-rays there about three months ago. She has had increasing pain over the last several weeks with paresthesias to the left knee and just below.  She was seen in the ER here 04-01-2019 for the back pain and I have reviewed those notes.  I have told her to get the notes and x-rays from her family doctor.  I will begin PT.   Body mass index is 22.21 kg/m.  ROS  Review of Systems  Constitutional: Positive for activity change.  Musculoskeletal: Positive for arthralgias, back pain, gait problem, joint swelling and neck pain.  Psychiatric/Behavioral: The patient is nervous/anxious.   All other systems reviewed and are negative.   All other systems reviewed and are negative.  The following is a summary of the past history medically, past history surgically, known current medicines, social history and family history.  This information is gathered electronically by the computer from prior information and documentation.  I review this each visit and have found including this information at this point in the chart is beneficial and informative.    Past Medical History:  Diagnosis Date  . Anxiety   . Arthritis   . Depression   . Knee pain, chronic     Past Surgical History:  Procedure Laterality Date  . ABDOMINAL HYSTERECTOMY    . DENTAL RESTORATION/EXTRACTION WITH X-RAY    . KNEE ARTHROSCOPY Right 01/25/2015   Procedure: ARTHROSCOPY KNEE;  Surgeon: Sanjuana Kava, MD;  Location: [facility];  Service: Orthopedics;  Laterality: Right;  . KNEE ARTHROSCOPY WITH MEDIAL MENISECTOMY Right 01/25/2015   Procedure: KNEE ARTHROSCOPY WITH PARTIAL MEDIAL MENISECTOMY;  Surgeon: Sanjuana Kava, MD;  Location: [facility];   Service: Orthopedics;  Laterality: Right;  . KNEE ARTHROSCOPY WITH MEDIAL MENISECTOMY Right 12/31/2017   Procedure: KNEE ARTHROSCOPY WITH MEDIAL MENISECTOMY;  Surgeon: Carole Civil, MD;  Location: [facility];  Service: Orthopedics;  Laterality: Right;  . TUBAL LIGATION      Family History  Problem Relation Age of Onset  . Cancer Mother   . Cancer Father     Social History Social History   Tobacco Use  . Smoking status: Current Every Day Smoker    Packs/day: 0.50    Years: 18.00    Pack years: 9.00  . Smokeless tobacco: Never Used  Substance Use Topics  . Alcohol use: No    Alcohol/week: 0.0 standard drinks    Comment: remote marjuana  . Drug use: Yes    Types: Marijuana    Comment: last used 03/31/2019    No Known Allergies  Current Outpatient Medications  Medication Sig Dispense Refill  . clonazePAM (KLONOPIN) 0.5 MG tablet Take 0.5 mg by mouth 2 (two) times daily.    . methocarbamol (ROBAXIN) 500 MG tablet Take 1 tablet (500 mg total) by mouth 2 (two) times daily. 20 tablet 0  . naproxen (NAPROSYN) 500 MG tablet Take 1 tablet (500 mg total) by mouth 2 (two) times daily. 30 tablet 0  . sertraline (ZOLOFT) 50 MG tablet Take 50 mg by mouth daily.  2  . traZODone (DESYREL) 100 MG tablet Take 100 mg by mouth at bedtime.    Marland Kitchen  traMADol (ULTRAM) 50 MG tablet Take 100 mg by mouth every 6 (six) hours as needed.     No current facility-administered medications for this visit.     Physical Exam  Blood pressure 112/77, pulse 69, height 6\' 2"  (1.88 m), weight 173 lb (78.5 kg), last menstrual period 05/23/2015.  Constitutional: overall normal hygiene, normal nutrition, well developed, normal grooming, normal body habitus. Assistive device:none  Musculoskeletal: gait and station Limp none, muscle tone and strength are normal, no tremors or atrophy is present.  .  Neurological: coordination overall normal.  Deep tendon reflex/nerve stretch intact.  Sensation normal.  Cranial  nerves II-XII intact.   Skin:   Normal overall no scars, lesions, ulcers or rashes. No psoriasis.  Psychiatric: Alert and oriented x 3.  Recent memory intact, remote memory unclear.  Normal mood and affect. Well groomed.  Good eye contact.  Cardiovascular: overall no swelling, no varicosities, no edema bilaterally, normal temperatures of the legs and arms, no clubbing, cyanosis and good capillary refill.  Lymphatic: palpation is normal.  Spine/Pelvis examination:  Inspection:  Overall, sacoiliac joint benign and hips nontender; without crepitus or defects.   Thoracic spine inspection: Alignment normal without kyphosis present   Lumbar spine inspection:  Alignment  with normal lumbar lordosis, without scoliosis apparent.   Thoracic spine palpation:  without tenderness of spinal processes   Lumbar spine palpation:  tenderness of lumbar area; with tightness of lumbar muscles    Range of Motion:   Lumbar flexion, forward flexion is abnormal with pain an tenderness    Lumbar extension is full without pain or tenderness   Left lateral bend is normal without pain or tenderness   Right lateral bend is normal without pain or tenderness   Straight leg raising is normal  Strength & tone: normal   Stability overall normal stability  All other systems reviewed and are negative   The patient has been educated about the nature of the problem(s) and counseled on treatment options.  The patient appeared to understand what I have discussed and is in agreement with it.  Encounter Diagnoses  Name Primary?  . Acute left-sided low back pain, unspecified whether sciatica present Yes  . Lumbar pain with radiation down left leg     PLAN Call if any problems.  Precautions discussed.  Continue current medications.   Return to clinic 2 weeks   Begin PT.  Get old notes and x-rays.  I have reviewed the 05/25/2015 Controlled Substance Reporting System web site prior to prescribing narcotic  medicine for this patient.   Electronically Signed [place], MD 4/6/20219:18 AM

## 2019-06-14 NOTE — Patient Instructions (Signed)
Steps to Quit Smoking Smoking tobacco is the leading cause of preventable death. It can affect almost every organ in the body. Smoking puts you and people around you at risk for many serious, long-lasting (chronic) diseases. Quitting smoking can be hard, but it is one of the best things that you can do for your health. It is never too late to quit. How do I get ready to quit? When you decide to quit smoking, make a plan to help you succeed. Before you quit:  Pick a date to quit. Set a date within the next 2 weeks to give you time to prepare.  Write down the reasons why you are quitting. Keep this list in places where you will see it often.  Tell your family, friends, and co-workers that you are quitting. Their support is important.  Talk with your doctor about the choices that may help you quit.  Find out if your health insurance will pay for these treatments.  Know the people, places, things, and activities that make you want to smoke (triggers). Avoid them. What first steps can I take to quit smoking?  Throw away all cigarettes at home, at work, and in your car.  Throw away the things that you use when you smoke, such as ashtrays and lighters.  Clean your car. Make sure to empty the ashtray.  Clean your home, including curtains and carpets. What can I do to help me quit smoking? Talk with your doctor about taking medicines and seeing a counselor at the same time. You are more likely to succeed when you do both.  If you are pregnant or breastfeeding, talk with your doctor about counseling or other ways to quit smoking. Do not take medicine to help you quit smoking unless your doctor tells you to do so. To quit smoking: Quit right away  Quit smoking totally, instead of slowly cutting back on how much you smoke over a period of time.  Go to counseling. You are more likely to quit if you go to counseling sessions regularly. Take medicine You may take medicines to help you quit. Some  medicines need a prescription, and some you can buy over-the-counter. Some medicines may contain a drug called nicotine to replace the nicotine in cigarettes. Medicines may:  Help you to stop having the desire to smoke (cravings).  Help to stop the problems that come when you stop smoking (withdrawal symptoms). Your doctor may ask you to use:  Nicotine patches, gum, or lozenges.  Nicotine inhalers or sprays.  Non-nicotine medicine that is taken by mouth. Find resources Find resources and other ways to help you quit smoking and remain smoke-free after you quit. These resources are most helpful when you use them often. They include:  Online chats with a counselor.  Phone quitlines.  Printed self-help materials.  Support groups or group counseling.  Text messaging programs.  Mobile phone apps. Use apps on your mobile phone or tablet that can help you stick to your quit plan. There are many free apps for mobile phones and tablets as well as websites. Examples include Quit Guide from the CDC and smokefree.gov  What things can I do to make it easier to quit?   Talk to your family and friends. Ask them to support and encourage you.  Call a phone quitline (1-800-QUIT-NOW), reach out to support groups, or work with a counselor.  Ask people who smoke to not smoke around you.  Avoid places that make you want to smoke,   such as: ? Bars. ? Parties. ? Smoke-break areas at work.  Spend time with people who do not smoke.  Lower the stress in your life. Stress can make you want to smoke. Try these things to help your stress: ? Getting regular exercise. ? Doing deep-breathing exercises. ? Doing yoga. ? Meditating. ? Doing a body scan. To do this, close your eyes, focus on one area of your body at a time from head to toe. Notice which parts of your body are tense. Try to relax the muscles in those areas. How will I feel when I quit smoking? Day 1 to 3 weeks Within the first 24 hours,  you may start to have some problems that come from quitting tobacco. These problems are very bad 2-3 days after you quit, but they do not often last for more than 2-3 weeks. You may get these symptoms:  Mood swings.  Feeling restless, nervous, angry, or annoyed.  Trouble concentrating.  Dizziness.  Strong desire for high-sugar foods and nicotine.  Weight gain.  Trouble pooping (constipation).  Feeling like you may vomit (nausea).  Coughing or a sore throat.  Changes in how the medicines that you take for other issues work in your body.  Depression.  Trouble sleeping (insomnia). Week 3 and afterward After the first 2-3 weeks of quitting, you may start to notice more positive results, such as:  Better sense of smell and taste.  Less coughing and sore throat.  Slower heart rate.  Lower blood pressure.  Clearer skin.  Better breathing.  Fewer sick days. Quitting smoking can be hard. Do not give up if you fail the first time. Some people need to try a few times before they succeed. Do your best to stick to your quit plan, and talk with your doctor if you have any questions or concerns. Summary  Smoking tobacco is the leading cause of preventable death. Quitting smoking can be hard, but it is one of the best things that you can do for your health.  When you decide to quit smoking, make a plan to help you succeed.  Quit smoking right away, not slowly over a period of time.  When you start quitting, seek help from your doctor, family, or friends. This information is not intended to replace advice given to you by your health care provider. Make sure you discuss any questions you have with your health care provider. Document Revised: 11/19/2018 Document Reviewed: 05/15/2018 [employer] Patient Education  2020 [employer]  

## 2019-06-20 ENCOUNTER — Ambulatory Visit (HOSPITAL_COMMUNITY): Payer: Medicare Other | Attending: Orthopaedic Surgery | Admitting: Physical Therapy

## 2019-06-20 ENCOUNTER — Encounter (HOSPITAL_COMMUNITY): Payer: Self-pay | Admitting: Physical Therapy

## 2019-06-20 ENCOUNTER — Other Ambulatory Visit: Payer: Self-pay

## 2019-06-20 ENCOUNTER — Telehealth: Payer: Self-pay | Admitting: Orthopaedic Surgery

## 2019-06-20 DIAGNOSIS — M545 Low back pain, unspecified: Secondary | ICD-10-CM

## 2019-06-20 DIAGNOSIS — M6281 Muscle weakness (generalized): Secondary | ICD-10-CM

## 2019-06-20 MED ORDER — HYDROCODONE-ACETAMINOPHEN 5-325 MG PO TABS: ORAL_TABLET | ORAL | 0 refills | Status: DC

## 2019-06-20 NOTE — Therapy (Signed)
[facility] [address] [place], [place], [address] Phone: [phone]   Fax:  [phone]  Physical Therapy Evaluation  Patient Details  Name: Courtney Patterson MRN: [id number] Date of Birth: 05-Sep-1980 Referring Provider (PT): Darreld Mclean MD    Encounter Date: 06/20/2019  PT End of Session - 06/20/19 1551    Visit Number  1    Number of Visits  18    Date for PT Re-Evaluation  08/05/19    Authorization Type  Medicare Part A (80/20)/ Medicaid secondary    Authorization Time Period  06/20/19-08/05/19    Progress Note Due on Visit  10    PT Start Time  1426    PT Stop Time  1503    PT Time Calculation (min)  37 min    Activity Tolerance  Patient limited by pain    Behavior During Therapy  [facility] for tasks assessed/performed       Past Medical History:  Diagnosis Date  . Anxiety   . Arthritis   . Depression   . Knee pain, chronic     Past Surgical History:  Procedure Laterality Date  . ABDOMINAL HYSTERECTOMY    . DENTAL RESTORATION/EXTRACTION WITH X-RAY    . KNEE ARTHROSCOPY Right 01/25/2015   Procedure: ARTHROSCOPY KNEE;  Surgeon: Darreld Mclean, MD;  Location: [facility];  Service: Orthopedics;  Laterality: Right;  . KNEE ARTHROSCOPY WITH MEDIAL MENISECTOMY Right 01/25/2015   Procedure: KNEE ARTHROSCOPY WITH PARTIAL MEDIAL MENISECTOMY;  Surgeon: Darreld Mclean, MD;  Location: [facility];  Service: Orthopedics;  Laterality: Right;  . KNEE ARTHROSCOPY WITH MEDIAL MENISECTOMY Right 12/31/2017   Procedure: KNEE ARTHROSCOPY WITH MEDIAL MENISECTOMY;  Surgeon: Vickki Hearing, MD;  Location: [facility];  Service: Orthopedics;  Laterality: Right;  . TUBAL LIGATION      There were no vitals filed for this visit.   Subjective Assessment - 06/20/19 1432    Subjective  Patient presents to physical therapy with complaint of chronic low back pain. Patient says, 5 years ago she fell in the shower and hurt her back on the LT side. She says her Doctor  thought she had a "back infection". Patient says she was given pain medication which helped pain for a while, but notes that he back began hurting more, insidiously, around January of this year. Patient says pain has gotten progressively worse since then and is so bad sometimes she wants to cry. Says she is supposed to get a pain medication refill this week. Patient has xrays from 2017 which are unremarkable. She says she is supposed to have updated xrays soon, but no one has gotten back to her about scheduling this.    Limitations  Sitting;Lifting;Standing;Walking;House hold activities    How long can you sit comfortably?  30 minutes    How long can you stand comfortably?  20 minutes    How long can you walk comfortably?  no problem, feels better moving    Patient Stated Goals  try to get rid of the pain    Currently in Pain?  Yes    Pain Score  7     Pain Location  Back    Pain Orientation  Posterior;Lower    Pain Descriptors / Indicators  Tightness;Shooting    Pain Type  Chronic pain    Pain Radiating Towards  Lt thigh    Pain Onset  More than a month ago    Pain Frequency  Constant  Aggravating Factors   bending, prolonged sitting, lying on LT side    Pain Relieving Factors  sit certain ways, meds    Effect of Pain on Daily Activities  Limits         [facility] PT Assessment - 06/20/19 0001      Assessment   Medical Diagnosis  LT side low back pain     Referring Provider (PT)  Sanjuana Kava MD     Onset Date/Surgical Date  --   chronic, about 5 years    Next MD Visit  06/28/19    Prior Therapy  Not for lumbar      Precautions   Precautions  None      Restrictions   Weight Bearing Restrictions  No      Balance Screen   Has the patient fallen in the past 6 months  No      [place] residence    Living Arrangements  Spouse/significant other;Children    Available Help at Discharge  Family      Prior Function   Level of Independence   Independent      Cognition   Overall Cognitive Status  Within Functional Limits for tasks assessed      Observation/Other Assessments   Focus on Therapeutic Outcomes (FOTO)   60% limitation       Sensation   Light Touch  Appears Intact      ROM / Strength   AROM / PROM / Strength  AROM;Strength      AROM   AROM Assessment Site  Lumbar    Lumbar Flexion  90% limited    Lumbar Extension  100% limited    Lumbar - Right Side Bend  75% limited    Lumbar - Left Side Bend  75% limited    Lumbar - Right Rotation  25% limited    Lumbar - Left Rotation  25% limited      Strength   Strength Assessment Site  Hip;Knee;Ankle    Right/Left Hip  Right;Left    Right Hip Flexion  3+/5    Right Hip Extension  3-/5    Right Hip ABduction  3+/5    Left Hip Flexion  3-/5    Left Hip Extension  3-/5    Left Hip ABduction  3-/5    Right/Left Knee  Right;Left    Right Knee Flexion  4+/5    Right Knee Extension  5/5    Left Knee Flexion  4/5    Left Knee Extension  4+/5    Right/Left Ankle  Right;Left    Right Ankle Dorsiflexion  5/5    Left Ankle Dorsiflexion  4+/5      Palpation   Palpation comment  mod/max tenderness to palpation about LT lumbar paraspinals      Transfers   Five time sit to stand comments   30 sec with no UEs      Ambulation/Gait   Ambulation/Gait  Yes    Ambulation/Gait Assistance  6: Modified independent (Device/Increase time)    Ambulation Distance (Feet)  80 Feet    Assistive device  None    Gait Pattern  Decreased arm swing - right;Decreased arm swing - left;Decreased stride length;Trendelenburg;Lateral trunk lean to left    Ambulation Surface  Level;Indoor      Balance   Balance Assessed  Yes      Static Standing Balance   Static Standing Balance -  Activities  Single Leg Stance - Right Leg;Single Leg Stance - Left Leg    Static Standing - Comment/# of Minutes  12 sec; 7 sec                Objective measurements completed on examination: See  above findings.              PT Education - 06/20/19 1435    Education Details  On evaluation findings and POC    Person(s) Educated  Patient    Methods  Explanation    Comprehension  Verbalized understanding       PT Short Term Goals - 06/20/19 1601      PT SHORT TERM GOAL #1   Title  Patient will be independent with initial HEP to improve functional outcomes    Time  3    Period  Weeks    Status  New    Target Date  07/15/19      PT SHORT TERM GOAL #2   Title  Patient will report at least 40% overall improvement in subjective complaint to indicate improvement in ability to perform ADLs.    Time  3    Period  Weeks    Status  New    Target Date  07/15/19        PT Long Term Goals - 06/20/19 1602      PT LONG TERM GOAL #1   Title  Patient will report at least 70% overall improvement in subjective complaint to indicate improvement in ability to perform ADLs.    Time  6    Period  Weeks    Status  New    Target Date  08/05/19      PT LONG TERM GOAL #2   Title  Patient will be able to perform stand x 5 in < 15 seconds to demonstrate improvement in functional mobility and reduced risk for falls.    Time  6    Period  Weeks    Status  New    Target Date  08/05/19      PT LONG TERM GOAL #3   Title  Patient will improve FOTO score to <50% to indicate improvement in functional outcomes    Time  6    Period  Weeks    Status  New    Target Date  08/05/19      PT LONG TERM GOAL #4   Title  Patient will have equal to or > 4/5 MMT throughout BLE to improve ability to perform functional mobility, stair ambulation and ADLs.    Time  6    Period  Weeks    Status  New    Target Date  08/05/19      PT LONG TERM GOAL #5   Title  Patient will improve lumbar flexion and extension by 25% to improve ability to perform functional mobility and ADLs.    Time  6    Period  Weeks    Status  New    Target Date  08/05/19             Plan - 06/20/19 1554     Clinical Impression Statement  Patient is a 39 y.o. female who presents to physical therapy with complaint of chronic low back pain. Patient demonstrates decreased strength, ROM restriction, balance deficits and gait abnormalities which are likely contributing to symptoms of pain and are negatively impacting patient ability to perform ADLs and functional mobility tasks. Patient will  benefit from skilled physical therapy services to address these deficits to reduce pain, improve level of function with ADLs, functional mobility tasks, and reduce risk for falls.    Personal Factors and Comorbidities  Time since onset of injury/illness/exacerbation    Examination-Activity Limitations  Bathing;Sit;Sleep;Bed Mobility;Bend;Squat;Stairs;Stand;Carry;Transfers;Lift;Locomotion Level;Dressing;Hygiene/Grooming    Examination-Participation Restrictions  Cleaning;Community Activity;Driving;Laundry    Stability/Clinical Decision Making  Stable/Uncomplicated    Clinical Decision Making  Low    Rehab Potential  Good    PT Frequency  3x / week    PT Duration  6 weeks    PT Treatment/Interventions  ADLs/Self Care Home Management;Aquatic Therapy;Biofeedback;Cryotherapy;Electrical Stimulation;Iontophoresis 4mg /ml Dexamethasone;Moist Heat;Traction;Balance training;Manual techniques;Therapeutic exercise;Therapeutic activities;Functional mobility training;Stair training;Orthotic Fit/Training;Gait training;DME Instruction;Fluidtherapy;Contrast Bath;Patient/family education;Ultrasound;Parrafin;Neuromuscular re-education;Compression bandaging;Passive range of motion;Spinal Manipulations;Joint Manipulations;Dry needling;Energy conservation;Splinting;Taping;Vasopneumatic Device    PT Next Visit Plan  Review goals. Issue HEP, to consist of ab set, diaphraghm breathing, supine clams, glute sets. Progress gentle lumbar mobility and isometric hip and core strengthening as able. Add manuals PRN for pain and lumbar restriction    PT Home  Exercise Plan  Issue at next visit    Consulted and Agree with Plan of Care  Patient       Patient will benefit from skilled therapeutic intervention in order to improve the following deficits and impairments:  Abnormal gait, Decreased endurance, Hypomobility, Pain, Decreased strength, Decreased activity tolerance, Decreased balance, Decreased mobility, Difficulty walking, Decreased range of motion, Improper body mechanics  Visit Diagnosis: Bilateral low back pain, unspecified chronicity, unspecified whether sciatica present  Muscle weakness (generalized)     Problem List Patient Active Problem List   Diagnosis Date Noted  . S/P arthroscopy of right knee 12/31/17    4:07 PM, 06/20/19 08/20/19 PT DPT  Physical Therapist with [facility]  [facility]  [phone]   [facility] [address] [place], [place], [place] Phone: [phone]   Fax:  [phone]  Name: Courtney Patterson MRN: Cato Mulligan Date of Birth: Jan 02, 1981

## 2019-06-20 NOTE — Telephone Encounter (Signed)
Patient requests refill on Hydrocodone/Acetaminophen 5-325  Mgs.  Qty  30  Sig: One tablet every four hours as needed for acute pain. Limit of five days per [place] statue.  Patient states she uses [employer]

## 2019-06-23 ENCOUNTER — Telehealth (HOSPITAL_COMMUNITY): Payer: Self-pay

## 2019-06-23 ENCOUNTER — Ambulatory Visit (HOSPITAL_COMMUNITY): Payer: Medicare Other

## 2019-06-23 NOTE — Telephone Encounter (Signed)
pt cancelled appt for today because she does not have a ride 

## 2019-06-27 ENCOUNTER — Ambulatory Visit (HOSPITAL_COMMUNITY): Payer: Medicare Other | Admitting: Physical Therapy

## 2019-06-27 ENCOUNTER — Encounter (HOSPITAL_COMMUNITY): Payer: Self-pay | Admitting: Physical Therapy

## 2019-06-27 ENCOUNTER — Other Ambulatory Visit: Payer: Self-pay

## 2019-06-27 DIAGNOSIS — M545 Low back pain, unspecified: Secondary | ICD-10-CM

## 2019-06-27 DIAGNOSIS — M6281 Muscle weakness (generalized): Secondary | ICD-10-CM

## 2019-06-27 NOTE — Therapy (Signed)
[facility] [address] [place], [place], [address] Phone: [phone]   Fax:  [phone]  Physical Therapy Treatment  Patient Details  Name: Courtney Patterson MRN: [id number] Date of Birth: Jun 27, 1980 Referring Provider (PT): Darreld Mclean MD    Encounter Date: 06/27/2019  PT End of Session - 06/27/19 1310    Visit Number  2    Number of Visits  18    Date for PT Re-Evaluation  08/05/19    Authorization Type  Medicare Part A (80/20)/ Medicaid secondary    Authorization Time Period  --    Progress Note Due on Visit  10    PT Start Time  1303    PT Stop Time  1345    PT Time Calculation (min)  42 min    Activity Tolerance  Patient limited by pain    Behavior During Therapy  [facility] for tasks assessed/performed       Past Medical History:  Diagnosis Date  . Anxiety   . Arthritis   . Depression   . Knee pain, chronic     Past Surgical History:  Procedure Laterality Date  . ABDOMINAL HYSTERECTOMY    . DENTAL RESTORATION/EXTRACTION WITH X-RAY    . KNEE ARTHROSCOPY Right 01/25/2015   Procedure: ARTHROSCOPY KNEE;  Surgeon: Darreld Mclean, MD;  Location: [facility];  Service: Orthopedics;  Laterality: Right;  . KNEE ARTHROSCOPY WITH MEDIAL MENISECTOMY Right 01/25/2015   Procedure: KNEE ARTHROSCOPY WITH PARTIAL MEDIAL MENISECTOMY;  Surgeon: Darreld Mclean, MD;  Location: [facility];  Service: Orthopedics;  Laterality: Right;  . KNEE ARTHROSCOPY WITH MEDIAL MENISECTOMY Right 12/31/2017   Procedure: KNEE ARTHROSCOPY WITH MEDIAL MENISECTOMY;  Surgeon: Vickki Hearing, MD;  Location: [facility];  Service: Orthopedics;  Laterality: Right;  . TUBAL LIGATION      There were no vitals filed for this visit.  Subjective Assessment - 06/27/19 1309    Subjective  Patient says she is doing ok today but had a rough day yesterday. Says she was busy this weekend with her grandson's birthday party, says she thinks this may have contributed to pain. Says she had  a tough time sitting on LT side yesterday but improved over time, today not so bad.    Limitations  Sitting;Lifting;Standing;Walking;House hold activities    How long can you sit comfortably?  30 minutes    How long can you stand comfortably?  20 minutes    How long can you walk comfortably?  no problem, feels better moving    Patient Stated Goals  try to get rid of the pain    Currently in Pain?  Yes    Pain Score  6     Pain Location  Back    Pain Orientation  Left;Posterior;Lower    Pain Descriptors / Indicators  Pressure;Shooting    Pain Type  Chronic pain    Pain Onset  More than a month ago                       [facility] Adult PT Treatment/Exercise - 06/27/19 0001      Exercises   Exercises  Lumbar      Lumbar Exercises: Supine   Ab Set  10 reps;5 seconds    Glut Set Limitations  attempted but increased pain     Clam  10 reps    Other Supine Lumbar Exercises  diagphragm breathing x 5     Other Supine Lumbar  Exercises  isometric hip abd 10 x5"             PT Education - 06/27/19 1348    Education Details  On exercise technique and HEP    Person(s) Educated  Patient    Methods  Explanation;Handout    Comprehension  Verbalized understanding       PT Short Term Goals - 06/20/19 1601      PT SHORT TERM GOAL #1   Title  Patient will be independent with initial HEP to improve functional outcomes    Time  3    Period  Weeks    Status  New    Target Date  07/15/19      PT SHORT TERM GOAL #2   Title  Patient will report at least 40% overall improvement in subjective complaint to indicate improvement in ability to perform ADLs.    Time  3    Period  Weeks    Status  New    Target Date  07/15/19        PT Long Term Goals - 06/20/19 1602      PT LONG TERM GOAL #1   Title  Patient will report at least 70% overall improvement in subjective complaint to indicate improvement in ability to perform ADLs.    Time  6    Period  Weeks    Status  New     Target Date  08/05/19      PT LONG TERM GOAL #2   Title  Patient will be able to perform stand x 5 in < 15 seconds to demonstrate improvement in functional mobility and reduced risk for falls.    Time  6    Period  Weeks    Status  New    Target Date  08/05/19      PT LONG TERM GOAL #3   Title  Patient will improve FOTO score to <50% to indicate improvement in functional outcomes    Time  6    Period  Weeks    Status  New    Target Date  08/05/19      PT LONG TERM GOAL #4   Title  Patient will have equal to or > 4/5 MMT throughout BLE to improve ability to perform functional mobility, stair ambulation and ADLs.    Time  6    Period  Weeks    Status  New    Target Date  08/05/19      PT LONG TERM GOAL #5   Title  Patient will improve lumbar flexion and extension by 25% to improve ability to perform functional mobility and ADLs.    Time  6    Period  Weeks    Status  New    Target Date  08/05/19            Plan - 06/27/19 1344    Clinical Impression Statement  Reviewed patient goals and issued HEP. Activity graded per patient tolerance. Patient educate on proper form and function of all HEP exercise, and cued on TA activation with abdominal strengthening. Patient required additional pillow under lumbar in supine for increased comfort. Attempted glute set in supine but patient with increased LT hip pain, activity held and isometric hip abd added. Patient tolerated this well. Patient educated on and issued HEP handout.    Personal Factors and Comorbidities  Time since onset of injury/illness/exacerbation    Examination-Activity Limitations  Bathing;Sit;Sleep;Bed Mobility;Bend;Squat;Stairs;Stand;Carry;Transfers;Lift;Locomotion Level;Dressing;Hygiene/Grooming  Examination-Participation Restrictions  Cleaning;Community Activity;Driving;Laundry    Stability/Clinical Decision Making  Stable/Uncomplicated    Rehab Potential  Good    PT Frequency  3x / week    PT Duration  6  weeks    PT Treatment/Interventions  ADLs/Self Care Home Management;Aquatic Therapy;Biofeedback;Cryotherapy;Electrical Stimulation;Iontophoresis 4mg /ml Dexamethasone;Moist Heat;Traction;Balance training;Manual techniques;Therapeutic exercise;Therapeutic activities;Functional mobility training;Stair training;Orthotic Fit/Training;Gait training;DME Instruction;Fluidtherapy;Contrast Bath;Patient/family education;Ultrasound;Parrafin;Neuromuscular re-education;Compression bandaging;Passive range of motion;Spinal Manipulations;Joint Manipulations;Dry needling;Energy conservation;Splinting;Taping;Vasopneumatic Device    PT Next Visit Plan  Progress gentle lumbar mobility and isometric hip and core strengthening as able. Add manuals PRN for pain and lumbar restriction    PT Home Exercise Plan  06/27/19: ab set, clam, isometric hip abd, diaphragm breathing    Consulted and Agree with Plan of Care  Patient       Patient will benefit from skilled therapeutic intervention in order to improve the following deficits and impairments:  Abnormal gait, Decreased endurance, Hypomobility, Pain, Decreased strength, Decreased activity tolerance, Decreased balance, Decreased mobility, Difficulty walking, Decreased range of motion, Improper body mechanics  Visit Diagnosis: Bilateral low back pain, unspecified chronicity, unspecified whether sciatica present  Muscle weakness (generalized)     Problem List Patient Active Problem List   Diagnosis Date Noted  . S/P arthroscopy of right knee 12/31/17    1:50 PM, 06/27/19 06/29/19 PT DPT  Physical Therapist with [place]  [facility]  [phone]   [facility] [address] [place], [place], [place] Phone: [phone]   Fax:  [phone]  Name: Courtney Patterson MRN: Cato Mulligan Date of Birth: 1980/04/09

## 2019-06-27 NOTE — Patient Instructions (Signed)
Access Code: [email or web address] URL: [email or web address] Date: 06/27/2019 Prepared by: Georges Lynch  Exercises Supine Transversus Abdominis Bracing - Hands on Stomach - 3 x daily - 7 x weekly - 1 sets - 10 reps - 5 hold Bent Knee Fallouts - 3 x daily - 7 x weekly - 1 sets - 10 reps Hooklying Isometric Clamshell - 3 x daily - 7 x weekly - 1 sets - 10 reps - 5 hold Supine Diaphragmatic Breathing - 3 x daily - 7 x weekly - 1 sets - 10 reps

## 2019-06-28 ENCOUNTER — Ambulatory Visit: Payer: Medicare Other

## 2019-06-28 ENCOUNTER — Ambulatory Visit (INDEPENDENT_AMBULATORY_CARE_PROVIDER_SITE_OTHER): Payer: Medicare Other | Admitting: Orthopaedic Surgery

## 2019-06-28 ENCOUNTER — Encounter: Payer: Self-pay | Admitting: Orthopaedic Surgery

## 2019-06-28 VITALS — BP 106/67 | HR 78 | Ht 74.0 in | Wt 173.0 lb

## 2019-06-28 DIAGNOSIS — M545 Low back pain, unspecified: Secondary | ICD-10-CM

## 2019-06-28 DIAGNOSIS — M79605 Pain in left leg: Secondary | ICD-10-CM

## 2019-06-28 MED ORDER — HYDROCODONE-ACETAMINOPHEN 5-325 MG PO TABS: ORAL_TABLET | ORAL | 0 refills | Status: DC

## 2019-06-28 MED ORDER — NAPROXEN 500 MG PO TABS: 500.0000 mg | ORAL_TABLET | Freq: Two times a day (BID) | ORAL | 0 refills | Status: DC

## 2019-06-28 NOTE — Progress Notes (Signed)
Patient Courtney Patterson, female DOB:[birth year], 39 y.o. ZHY:[id number]  Chief Complaint  Patient presents with  . Back Pain  . Medication Refill    hydrocodone naprosyn     HPI  Courtney Patterson is a 39 y.o. female who has continued lower back pain.  She has been going to PT and I have reviewed the notes.  Her pain is a little less but she has left sided paresthesias.  I have received her notes from her primary care and a CD of the x-rays of the left hip.  I have independently reviewed and interpreted x-rays of this patient done at another site by another physician or qualified health professional.  She is taking her medicine.  Her father died yesterday and she is very emotional.   Body mass index is 22.21 kg/m.  ROS  Review of Systems  Constitutional: Positive for activity change.  Musculoskeletal: Positive for arthralgias, back pain, gait problem, joint swelling and neck pain.  Psychiatric/Behavioral: The patient is nervous/anxious.   All other systems reviewed and are negative.   All other systems reviewed and are negative.  The following is a summary of the past history medically, past history surgically, known current medicines, social history and family history.  This information is gathered electronically by the computer from prior information and documentation.  I review this each visit and have found including this information at this point in the chart is beneficial and informative.    Past Medical History:  Diagnosis Date  . Anxiety   . Arthritis   . Depression   . Knee pain, chronic     Past Surgical History:  Procedure Laterality Date  . ABDOMINAL HYSTERECTOMY    . DENTAL RESTORATION/EXTRACTION WITH X-RAY    . KNEE ARTHROSCOPY Right 01/25/2015   Procedure: ARTHROSCOPY KNEE;  Surgeon: Darreld Mclean, MD;  Location: [facility];  Service: Orthopedics;  Laterality: Right;  . KNEE ARTHROSCOPY WITH MEDIAL MENISECTOMY Right 01/25/2015   Procedure: KNEE  ARTHROSCOPY WITH PARTIAL MEDIAL MENISECTOMY;  Surgeon: Darreld Mclean, MD;  Location: [facility];  Service: Orthopedics;  Laterality: Right;  . KNEE ARTHROSCOPY WITH MEDIAL MENISECTOMY Right 12/31/2017   Procedure: KNEE ARTHROSCOPY WITH MEDIAL MENISECTOMY;  Surgeon: Vickki Hearing, MD;  Location: [facility];  Service: Orthopedics;  Laterality: Right;  . TUBAL LIGATION      Family History  Problem Relation Age of Onset  . Cancer Mother   . Cancer Father     Social History Social History   Tobacco Use  . Smoking status: Current Every Day Smoker    Packs/day: 0.50    Years: 18.00    Pack years: 9.00  . Smokeless tobacco: Never Used  Substance Use Topics  . Alcohol use: No    Alcohol/week: 0.0 standard drinks    Comment: remote marjuana  . Drug use: Yes    Types: Marijuana    Comment: last used 03/31/2019    No Known Allergies  Current Outpatient Medications  Medication Sig Dispense Refill  . clonazePAM (KLONOPIN) 0.5 MG tablet Take 0.5 mg by mouth 2 (two) times daily.    Marland Kitchen HYDROcodone-acetaminophen (NORCO/VICODIN) 5-325 MG tablet One tablet every six hours for pain.  Limit 7 days. 28 tablet 0  . naproxen (NAPROSYN) 500 MG tablet Take 1 tablet (500 mg total) by mouth 2 (two) times daily. 30 tablet 0  . sertraline (ZOLOFT) 50 MG tablet Take 50 mg by mouth daily.  2  . traZODone (DESYREL) 100 MG tablet Take 100  mg by mouth at bedtime.     No current facility-administered medications for this visit.     Physical Exam  Blood pressure 106/67, pulse 78, height 6\' 2"  (1.88 m), weight 173 lb (78.5 kg), last menstrual period 05/23/2015.  Constitutional: overall normal hygiene, normal nutrition, well developed, normal grooming, normal body habitus. Assistive device:none  Musculoskeletal: gait and station Limp none, muscle tone and strength are normal, no tremors or atrophy is present.  .  Neurological: coordination overall normal.  Deep tendon reflex/nerve stretch intact.   Sensation normal.  Cranial nerves II-XII intact.   Skin:   Normal overall no scars, lesions, ulcers or rashes. No psoriasis.  Psychiatric: Alert and oriented x 3.  Recent memory intact, remote memory unclear.  Normal mood and affect. Well groomed.  Good eye contact.  Cardiovascular: overall no swelling, no varicosities, no edema bilaterally, normal temperatures of the legs and arms, no clubbing, cyanosis and good capillary refill.  Lymphatic: palpation is normal.  Spine/Pelvis examination:  Inspection:  Overall, sacoiliac joint benign and hips nontender; without crepitus or defects.   Thoracic spine inspection: Alignment normal without kyphosis present   Lumbar spine inspection:  Alignment  with normal lumbar lordosis, without scoliosis apparent.   Thoracic spine palpation:  without tenderness of spinal processes   Lumbar spine palpation: without tenderness of lumbar area; without tightness of lumbar muscles    Range of Motion:   Lumbar flexion, forward flexion is normal without pain or tenderness    Lumbar extension is full without pain or tenderness   Left lateral bend is normal without pain or tenderness   Right lateral bend is normal without pain or tenderness   Straight leg raising is normal  Strength & tone: normal   Stability overall normal stability  All other systems reviewed and are negative   The patient has been educated about the nature of the problem(s) and counseled on treatment options.  The patient appeared to understand what I have discussed and is in agreement with it.  Encounter Diagnosis  Name Primary?  . Lumbar pain with radiation down left leg Yes    PLAN Call if any problems.  Precautions discussed.  Continue current medications.   Return to clinic 2 weeks   I have reviewed the [place] web site prior to prescribing narcotic medicine for this patient.   I did refill her Naprosyn.  Electronically  Signed Sanjuana Kava, MD 4/20/202110:06 AM

## 2019-06-29 ENCOUNTER — Encounter (HOSPITAL_COMMUNITY): Payer: Self-pay

## 2019-06-29 ENCOUNTER — Ambulatory Visit (HOSPITAL_COMMUNITY): Payer: Medicare Other

## 2019-06-29 ENCOUNTER — Other Ambulatory Visit: Payer: Self-pay

## 2019-06-29 DIAGNOSIS — M545 Low back pain, unspecified: Secondary | ICD-10-CM

## 2019-06-29 DIAGNOSIS — M6281 Muscle weakness (generalized): Secondary | ICD-10-CM

## 2019-06-29 NOTE — Therapy (Signed)
[facility] [address] [place], [place], [address] Phone: [phone]   Fax:  [phone]  Physical Therapy Treatment  Patient Details  Name: Courtney Patterson MRN: [id number] Date of Birth: 12/31/1980 Referring Provider (PT): Darreld Mclean MD    Encounter Date: 06/29/2019  PT End of Session - 06/29/19 1343    Visit Number  3    Number of Visits  18    Date for PT Re-Evaluation  08/05/19    Authorization Type  Medicare Part A (80/20)/ Medicaid secondary    Progress Note Due on Visit  10    PT Start Time  1316    PT Stop Time  1400    PT Time Calculation (min)  44 min    Activity Tolerance  Patient limited by pain    Behavior During Therapy  [facility] for tasks assessed/performed       Past Medical History:  Diagnosis Date  . Anxiety   . Arthritis   . Depression   . Knee pain, chronic     Past Surgical History:  Procedure Laterality Date  . ABDOMINAL HYSTERECTOMY    . DENTAL RESTORATION/EXTRACTION WITH X-RAY    . KNEE ARTHROSCOPY Right 01/25/2015   Procedure: ARTHROSCOPY KNEE;  Surgeon: Darreld Mclean, MD;  Location: [facility];  Service: Orthopedics;  Laterality: Right;  . KNEE ARTHROSCOPY WITH MEDIAL MENISECTOMY Right 01/25/2015   Procedure: KNEE ARTHROSCOPY WITH PARTIAL MEDIAL MENISECTOMY;  Surgeon: Darreld Mclean, MD;  Location: [facility];  Service: Orthopedics;  Laterality: Right;  . KNEE ARTHROSCOPY WITH MEDIAL MENISECTOMY Right 12/31/2017   Procedure: KNEE ARTHROSCOPY WITH MEDIAL MENISECTOMY;  Surgeon: Vickki Hearing, MD;  Location: [facility];  Service: Orthopedics;  Laterality: Right;  . TUBAL LIGATION      There were no vitals filed for this visit.  Subjective Assessment - 06/29/19 1316    Subjective  Pt stated she is feeling good today, went to MD yesterday and given pain medication.  No reports of pain currently.    Patient Stated Goals  try to get rid of the pain    Currently in Pain?  No/denies         [facility] PT  Assessment - 06/29/19 0001      Assessment   Medical Diagnosis  LT side low back pain     Referring Provider (PT)  Darreld Mclean MD     Onset Date/Surgical Date  --   chronic, 5 years   Next MD Visit  07/12/19    Prior Therapy  Not for lumbar                   [facility] Adult PT Treatment/Exercise - 06/29/19 0001      Exercises   Exercises  Lumbar      Lumbar Exercises: Stretches   Single Knee to Chest Stretch  2 reps;30 seconds    Single Knee to Chest Stretch Limitations  Lt LE decreased pain due to pain    Lower Trunk Rotation  5 reps;10 seconds    Standing Extension  5 reps   2 sets x 5 reps; reports decreased pain with increased reps   Other Lumbar Stretch Exercise  QL 3x 20"      Lumbar Exercises: Seated   Sit to Stand  5 reps    Sit to Stand Limitations  no HHA, slow control    Other Seated Lumbar Exercises         Lumbar Exercises: Supine  Ab Set  10 reps;5 seconds    AB Set Limitations  paired wiht exhale    Other Supine Lumbar Exercises  diagphragm breathing x 5     Other Supine Lumbar Exercises  isometric hip abd with hip  10 x5"      Manual Therapy   Manual Therapy  Soft tissue mobilization    Manual therapy comments  Manual complete separate than rest of tx    Soft tissue mobilization  STM to Lt QL during active stretches and static sitting, increased pain with palpation               PT Short Term Goals - 06/20/19 1601      PT SHORT TERM GOAL #1   Title  Patient will be independent with initial HEP to improve functional outcomes    Time  3    Period  Weeks    Status  New    Target Date  07/15/19      PT SHORT TERM GOAL #2   Title  Patient will report at least 40% overall improvement in subjective complaint to indicate improvement in ability to perform ADLs.    Time  3    Period  Weeks    Status  New    Target Date  07/15/19        PT Long Term Goals - 06/20/19 1602      PT LONG TERM GOAL #1   Title  Patient will report at  least 70% overall improvement in subjective complaint to indicate improvement in ability to perform ADLs.    Time  6    Period  Weeks    Status  New    Target Date  08/05/19      PT LONG TERM GOAL #2   Title  Patient will be able to perform stand x 5 in < 15 seconds to demonstrate improvement in functional mobility and reduced risk for falls.    Time  6    Period  Weeks    Status  New    Target Date  08/05/19      PT LONG TERM GOAL #3   Title  Patient will improve FOTO score to <50% to indicate improvement in functional outcomes    Time  6    Period  Weeks    Status  New    Target Date  08/05/19      PT LONG TERM GOAL #4   Title  Patient will have equal to or > 4/5 MMT throughout BLE to improve ability to perform functional mobility, stair ambulation and ADLs.    Time  6    Period  Weeks    Status  New    Target Date  08/05/19      PT LONG TERM GOAL #5   Title  Patient will improve lumbar flexion and extension by 25% to improve ability to perform functional mobility and ADLs.    Time  6    Period  Weeks    Status  New    Target Date  08/05/19            Plan - 06/29/19 1457    Clinical Impression Statement  Added mobility exercises to POC, pt initially limited by pain wiht extension based exercises though reports some relief following multiple reps.  Noted spasm on Lt QL with reports of increased pain during palpation.  Gentle STM complete to address spasm.  Reviewed diaphragmatic breathing paired with abdominal  as well as breathing for pain control.  Pt was limited by pain at EOS>    Personal Factors and Comorbidities  Time since onset of injury/illness/exacerbation    Examination-Activity Limitations  Bathing;Sit;Sleep;Bed Mobility;Bend;Squat;Stairs;Stand;Carry;Transfers;Lift;Locomotion Level;Dressing;Hygiene/Grooming    Examination-Participation Restrictions  Cleaning;Community Activity;Driving;Laundry    Stability/Clinical Decision Making  Stable/Uncomplicated     Clinical Decision Making  Low    Rehab Potential  Good    PT Frequency  3x / week    PT Duration  6 weeks    PT Treatment/Interventions  ADLs/Self Care Home Management;Aquatic Therapy;Biofeedback;Cryotherapy;Electrical Stimulation;Iontophoresis 4mg /ml Dexamethasone;Moist Heat;Traction;Balance training;Manual techniques;Therapeutic exercise;Therapeutic activities;Functional mobility training;Stair training;Orthotic Fit/Training;Gait training;DME Instruction;Fluidtherapy;Contrast Bath;Patient/family education;Ultrasound;Parrafin;Neuromuscular re-education;Compression bandaging;Passive range of motion;Spinal Manipulations;Joint Manipulations;Dry needling;Energy conservation;Splinting;Taping;Vasopneumatic Device    PT Next Visit Plan  Progress gentle lumbar mobility and isometric hip and core strengthening as able. Add manuals PRN for pain and lumbar restriction    PT Home Exercise Plan  06/27/19: ab set, clam, isometric hip abd, diaphragm breathing       Patient will benefit from skilled therapeutic intervention in order to improve the following deficits and impairments:  Abnormal gait, Decreased endurance, Hypomobility, Pain, Decreased strength, Decreased activity tolerance, Decreased balance, Decreased mobility, Difficulty walking, Decreased range of motion, Improper body mechanics  Visit Diagnosis: Muscle weakness (generalized)  Bilateral low back pain, unspecified chronicity, unspecified whether sciatica present     Problem List Patient Active Problem List   Diagnosis Date Noted  . S/P arthroscopy of right knee 12/31/17    Ihor Austin, LPTA/CLT; CBIS [phone]  Aldona Lento 06/29/2019, 3:01 PM  [place], [place], [address] Phone: [phone]   Fax:  [phone]  Name: KENZLY ROGOFF MRN: [id number] Date of Birth: [birth year]

## 2019-07-01 ENCOUNTER — Ambulatory Visit (HOSPITAL_COMMUNITY): Payer: Medicare Other | Admitting: Physical Therapy

## 2019-07-01 ENCOUNTER — Telehealth (HOSPITAL_COMMUNITY): Payer: Self-pay | Admitting: Physical Therapy

## 2019-07-01 NOTE — Telephone Encounter (Signed)
Called regarding patient not showing for appointment this date. Patient did not answer, so left a message reminding of next scheduled appointment and providing clinic phone number for patient to call if unable to make the appointment.  Verne Carrow PT, DPT 2:21 PM, 07/01/19 [phone]

## 2019-07-04 ENCOUNTER — Telehealth (HOSPITAL_COMMUNITY): Payer: Self-pay | Admitting: Physical Therapy

## 2019-07-04 ENCOUNTER — Ambulatory Visit (HOSPITAL_COMMUNITY): Payer: Medicare Other | Admitting: Physical Therapy

## 2019-07-04 NOTE — Telephone Encounter (Signed)
Pt has been sick all weekend and will quarantine for 14 days - she will return on May 14th.

## 2019-07-05 ENCOUNTER — Telehealth: Payer: Self-pay | Admitting: Orthopaedic Surgery

## 2019-07-05 MED ORDER — HYDROCODONE-ACETAMINOPHEN 5-325 MG PO TABS: ORAL_TABLET | ORAL | 0 refills | Status: DC

## 2019-07-05 NOTE — Telephone Encounter (Signed)
Patient called for refill:  HYDROcodone-acetaminophen (NORCO/VICODIN) 5-325 MG tablet 28 tablet   - [facility]

## 2019-07-06 ENCOUNTER — Ambulatory Visit (HOSPITAL_COMMUNITY): Payer: Medicare Other | Admitting: Physical Therapy

## 2019-07-08 ENCOUNTER — Ambulatory Visit (HOSPITAL_COMMUNITY): Payer: Medicare Other

## 2019-07-11 ENCOUNTER — Ambulatory Visit (HOSPITAL_COMMUNITY): Payer: Medicare Other | Admitting: Physical Therapy

## 2019-07-12 ENCOUNTER — Encounter: Payer: Self-pay | Admitting: Orthopaedic Surgery

## 2019-07-12 ENCOUNTER — Ambulatory Visit (INDEPENDENT_AMBULATORY_CARE_PROVIDER_SITE_OTHER): Payer: Medicare Other | Admitting: Orthopaedic Surgery

## 2019-07-12 ENCOUNTER — Other Ambulatory Visit: Payer: Self-pay

## 2019-07-12 VITALS — Temp 98.0°F | Ht 74.0 in | Wt 175.0 lb

## 2019-07-12 DIAGNOSIS — M545 Low back pain, unspecified: Secondary | ICD-10-CM

## 2019-07-12 DIAGNOSIS — M79605 Pain in left leg: Secondary | ICD-10-CM | POA: Diagnosis not present

## 2019-07-12 MED ORDER — HYDROCODONE-ACETAMINOPHEN 5-325 MG PO TABS: ORAL_TABLET | ORAL | 0 refills | Status: DC

## 2019-07-12 NOTE — Progress Notes (Signed)
Patient AL:PFXTK JENNINE PEDDY, female DOB:03/02/1981, 39 y.o. WIO:[id number]  Chief Complaint  Patient presents with  . Back Pain    HPI  ALIZ MERITT is a 39 y.o. female who has chronic lower back pain.  Her father died recently and the funeral is next Thursday.  She has been dealing with it all as she is the only relative in town.  Her brother will be coming in early next week.  She has been doing PT but she has increased pain secondary to her grief and having to get things done for the funeral.  I told her to try to continue PT but if necessary, put it off for a while.     Body mass index is 22.47 kg/m.  ROS  Review of Systems  Constitutional: Positive for activity change.  Musculoskeletal: Positive for arthralgias, back pain, gait problem, joint swelling and neck pain.  Psychiatric/Behavioral: The patient is nervous/anxious.   All other systems reviewed and are negative.   All other systems reviewed and are negative.  The following is a summary of the past history medically, past history surgically, known current medicines, social history and family history.  This information is gathered electronically by the computer from prior information and documentation.  I review this each visit and have found including this information at this point in the chart is beneficial and informative.    Past Medical History:  Diagnosis Date  . Anxiety   . Arthritis   . Depression   . Knee pain, chronic     Past Surgical History:  Procedure Laterality Date  . ABDOMINAL HYSTERECTOMY    . DENTAL RESTORATION/EXTRACTION WITH X-RAY    . KNEE ARTHROSCOPY Right 01/25/2015   Procedure: ARTHROSCOPY KNEE;  Surgeon: Darreld Mclean, MD;  Location: [facility];  Service: Orthopedics;  Laterality: Right;  . KNEE ARTHROSCOPY WITH MEDIAL MENISECTOMY Right 01/25/2015   Procedure: KNEE ARTHROSCOPY WITH PARTIAL MEDIAL MENISECTOMY;  Surgeon: Darreld Mclean, MD;  Location: [facility];  Service: Orthopedics;  Laterality:  Right;  . KNEE ARTHROSCOPY WITH MEDIAL MENISECTOMY Right 12/31/2017   Procedure: KNEE ARTHROSCOPY WITH MEDIAL MENISECTOMY;  Surgeon: Vickki Hearing, MD;  Location: [facility];  Service: Orthopedics;  Laterality: Right;  . TUBAL LIGATION      Family History  Problem Relation Age of Onset  . Cancer Mother   . Cancer Father     Social History Social History   Tobacco Use  . Smoking status: Current Every Day Smoker    Packs/day: 0.50    Years: 18.00    Pack years: 9.00  . Smokeless tobacco: Never Used  Substance Use Topics  . Alcohol use: No    Alcohol/week: 0.0 standard drinks    Comment: remote marjuana  . Drug use: Yes    Types: Marijuana    Comment: last used 03/31/2019    No Known Allergies  Current Outpatient Medications  Medication Sig Dispense Refill  . clonazePAM (KLONOPIN) 0.5 MG tablet Take 0.5 mg by mouth 2 (two) times daily.    Marland Kitchen HYDROcodone-acetaminophen (NORCO/VICODIN) 5-325 MG tablet One tablet every six hours for pain.  Limit 7 days. 28 tablet 0  . naproxen (NAPROSYN) 500 MG tablet Take 1 tablet (500 mg total) by mouth 2 (two) times daily. 30 tablet 0  . sertraline (ZOLOFT) 50 MG tablet Take 50 mg by mouth daily.  2  . traZODone (DESYREL) 100 MG tablet Take 100 mg by mouth at bedtime.     No current facility-administered medications  for this visit.     Physical Exam  Temperature 98 F (36.7 C), height 6\' 2"  (1.88 m), weight 175 lb (79.4 kg), last menstrual period 05/23/2015.  Constitutional: overall normal hygiene, normal nutrition, well developed, normal grooming, normal body habitus. Assistive device:none  Musculoskeletal: gait and station Limp none, muscle tone and strength are normal, no tremors or atrophy is present.  .  Neurological: coordination overall normal.  Deep tendon reflex/nerve stretch intact.  Sensation normal.  Cranial nerves II-XII intact.   Skin:   Normal overall no scars, lesions, ulcers or rashes. No  psoriasis.  Psychiatric: Alert and oriented x 3.  Recent memory intact, remote memory unclear.  Normal mood and affect. Well groomed.  Good eye contact.  Cardiovascular: overall no swelling, no varicosities, no edema bilaterally, normal temperatures of the legs and arms, no clubbing, cyanosis and good capillary refill.  Lymphatic: palpation is normal.  Spine/Pelvis examination:  Inspection:  Overall, sacoiliac joint benign and hips nontender; without crepitus or defects.   Thoracic spine inspection: Alignment normal without kyphosis present   Lumbar spine inspection:  Alignment  with normal lumbar lordosis, without scoliosis apparent.   Thoracic spine palpation:  without tenderness of spinal processes   Lumbar spine palpation: without tenderness of lumbar area; without tightness of lumbar muscles    Range of Motion:   Lumbar flexion, forward flexion is normal without pain or tenderness    Lumbar extension is full without pain or tenderness   Left lateral bend is normal without pain or tenderness   Right lateral bend is normal without pain or tenderness   Straight leg raising is normal  Strength & tone: normal   Stability overall normal stability  All other systems reviewed and are negative   The patient has been educated about the nature of the problem(s) and counseled on treatment options.  The patient appeared to understand what I have discussed and is in agreement with it.  Encounter Diagnosis  Name Primary?  . Lumbar pain with radiation down left leg Yes    PLAN Call if any problems.  Precautions discussed.  Continue current medications.   Return to clinic 2 weeks   I have reviewed the [place] web site prior to prescribing narcotic medicine for this patient.   Electronically Signed Sanjuana Kava, MD 5/4/202111:01 AM

## 2019-07-13 ENCOUNTER — Encounter (HOSPITAL_COMMUNITY): Payer: Medicare Other | Admitting: Physical Therapy

## 2019-07-15 ENCOUNTER — Encounter (HOSPITAL_COMMUNITY): Payer: Medicare Other

## 2019-07-18 ENCOUNTER — Encounter (HOSPITAL_COMMUNITY): Payer: Medicare Other | Admitting: Physical Therapy

## 2019-07-19 ENCOUNTER — Telehealth: Payer: Self-pay | Admitting: Orthopaedic Surgery

## 2019-07-19 MED ORDER — HYDROCODONE-ACETAMINOPHEN 5-325 MG PO TABS: ORAL_TABLET | ORAL | 0 refills | Status: DC

## 2019-07-19 NOTE — Telephone Encounter (Signed)
Patient requests refill on 7-day medication:  HYDROcodone-acetaminophen (NORCO/VICODIN) 5-325 MG tablet 28 tablet  -[facility]  -aware of appointment in 1 week (07/26/19)

## 2019-07-20 ENCOUNTER — Encounter (HOSPITAL_COMMUNITY): Payer: Medicare Other

## 2019-07-22 ENCOUNTER — Ambulatory Visit (HOSPITAL_COMMUNITY): Payer: Medicare Other | Attending: Orthopaedic Surgery | Admitting: Physical Therapy

## 2019-07-22 ENCOUNTER — Encounter (HOSPITAL_COMMUNITY): Payer: Self-pay | Admitting: Physical Therapy

## 2019-07-22 ENCOUNTER — Other Ambulatory Visit: Payer: Self-pay

## 2019-07-22 DIAGNOSIS — M545 Low back pain, unspecified: Secondary | ICD-10-CM

## 2019-07-22 DIAGNOSIS — M6281 Muscle weakness (generalized): Secondary | ICD-10-CM | POA: Insufficient documentation

## 2019-07-22 NOTE — Therapy (Signed)
[facility] [address] [place], [place], [address] Phone: [phone]   Fax:  [phone]  Physical Therapy Treatment  Patient Details  Name: Courtney Patterson MRN: [id number] Date of Birth: 02-03-1981 Referring Provider (PT): Darreld Mclean MD    Encounter Date: 07/22/2019  PT End of Session - 07/22/19 0957    Visit Number  4    Number of Visits  18    Date for PT Re-Evaluation  08/05/19    Authorization Type  Medicare Part A (80/20)/ Medicaid secondary    Progress Note Due on Visit  10    PT Start Time  0959   arrived late   PT Stop Time  1028    PT Time Calculation (min)  29 min    Activity Tolerance  Patient limited by pain    Behavior During Therapy  [facility] for tasks assessed/performed       Past Medical History:  Diagnosis Date  . Anxiety   . Arthritis   . Depression   . Knee pain, chronic     Past Surgical History:  Procedure Laterality Date  . ABDOMINAL HYSTERECTOMY    . DENTAL RESTORATION/EXTRACTION WITH X-RAY    . KNEE ARTHROSCOPY Right 01/25/2015   Procedure: ARTHROSCOPY KNEE;  Surgeon: Darreld Mclean, MD;  Location: [facility];  Service: Orthopedics;  Laterality: Right;  . KNEE ARTHROSCOPY WITH MEDIAL MENISECTOMY Right 01/25/2015   Procedure: KNEE ARTHROSCOPY WITH PARTIAL MEDIAL MENISECTOMY;  Surgeon: Darreld Mclean, MD;  Location: [facility];  Service: Orthopedics;  Laterality: Right;  . KNEE ARTHROSCOPY WITH MEDIAL MENISECTOMY Right 12/31/2017   Procedure: KNEE ARTHROSCOPY WITH MEDIAL MENISECTOMY;  Surgeon: Vickki Hearing, MD;  Location: [facility];  Service: Orthopedics;  Laterality: Right;  . TUBAL LIGATION      There were no vitals filed for this visit.  Subjective Assessment - 07/22/19 1003    Subjective  Patient states her RT hip and low back have been hurting more lately. Says she has had a lot going on and has been having some family issues. Say she had some pain on LT side with breathing the other day, doesn't  bother her now. Reports compliance with HEP, says these are going well.    Patient Stated Goals  try to get rid of the pain    Currently in Pain?  Yes    Pain Score  7     Pain Location  Back    Pain Orientation  Posterior;Right    Pain Descriptors / Indicators  Sharp;Tightness    Pain Type  Chronic pain    Pain Onset  More than a month ago    Pain Frequency  Constant                        [facility] Adult PT Treatment/Exercise - 07/22/19 0001      Lumbar Exercises: Supine   Ab Set  10 reps;5 seconds    Glut Set  10 reps;5 seconds    Clam  10 reps    Heel Slides  10 reps    Heel Slides Limitations  discomfort towards end on LT     Bridge  10 reps;5 seconds      Manual Therapy   Manual Therapy  Soft tissue mobilization    Manual therapy comments  Manual complete separate than rest of tx    Soft tissue mobilization  Gentle STM to bilateral lumbothoracic paracpinals using tennis ball for  pain and restriction, patient in prone                PT Short Term Goals - 06/20/19 1601      PT SHORT TERM GOAL #1   Title  Patient will be independent with initial HEP to improve functional outcomes    Time  3    Period  Weeks    Status  New    Target Date  07/15/19      PT SHORT TERM GOAL #2   Title  Patient will report at least 40% overall improvement in subjective complaint to indicate improvement in ability to perform ADLs.    Time  3    Period  Weeks    Status  New    Target Date  07/15/19        PT Long Term Goals - 06/20/19 1602      PT LONG TERM GOAL #1   Title  Patient will report at least 70% overall improvement in subjective complaint to indicate improvement in ability to perform ADLs.    Time  6    Period  Weeks    Status  New    Target Date  08/05/19      PT LONG TERM GOAL #2   Title  Patient will be able to perform stand x 5 in < 15 seconds to demonstrate improvement in functional mobility and reduced risk for falls.    Time  6    Period   Weeks    Status  New    Target Date  08/05/19      PT LONG TERM GOAL #3   Title  Patient will improve FOTO score to <50% to indicate improvement in functional outcomes    Time  6    Period  Weeks    Status  New    Target Date  08/05/19      PT LONG TERM GOAL #4   Title  Patient will have equal to or > 4/5 MMT throughout BLE to improve ability to perform functional mobility, stair ambulation and ADLs.    Time  6    Period  Weeks    Status  New    Target Date  08/05/19      PT LONG TERM GOAL #5   Title  Patient will improve lumbar flexion and extension by 25% to improve ability to perform functional mobility and ADLs.    Time  6    Period  Weeks    Status  New    Target Date  08/05/19            Plan - 07/22/19 1228    Clinical Impression Statement  Patient with ongoing pain with activity. Activity and exercise today graded to patient tolerance. Patient able to progress hip strengthening with bridging, but with noted discomfort. Added STM to lumbar/ thoracic paraspinals per patient subjective complaint of pain. Patient did note improvement in symptoms post treatment. Patient will continue to benefit from skilled therapy services to progress hip and core strengthening to reduce pain and improve LOF with ADLs.    Personal Factors and Comorbidities  Time since onset of injury/illness/exacerbation    Examination-Activity Limitations  Bathing;Sit;Sleep;Bed Mobility;Bend;Squat;Stairs;Stand;Carry;Transfers;Lift;Locomotion Level;Dressing;Hygiene/Grooming    Examination-Participation Restrictions  Cleaning;Community Activity;Driving;Laundry    Stability/Clinical Decision Making  Stable/Uncomplicated    Rehab Potential  Good    PT Frequency  3x / week    PT Duration  6 weeks    PT Treatment/Interventions  ADLs/Self Care Home Management;Aquatic Therapy;Biofeedback;Cryotherapy;Electrical Stimulation;Iontophoresis 4mg /ml Dexamethasone;Moist Heat;Traction;Balance training;Manual  techniques;Therapeutic exercise;Therapeutic activities;Functional mobility training;Stair training;Orthotic Fit/Training;Gait training;DME Instruction;Fluidtherapy;Contrast Bath;Patient/family education;Ultrasound;Parrafin;Neuromuscular re-education;Compression bandaging;Passive range of motion;Spinal Manipulations;Joint Manipulations;Dry needling;Energy conservation;Splinting;Taping;Vasopneumatic Device    PT Next Visit Plan  Reassess next visit    PT Home Exercise Plan  06/27/19: ab set, clam, isometric hip abd, diaphragm breathing    Consulted and Agree with Plan of Care  Patient       Patient will benefit from skilled therapeutic intervention in order to improve the following deficits and impairments:  Abnormal gait, Decreased endurance, Hypomobility, Pain, Decreased strength, Decreased activity tolerance, Decreased balance, Decreased mobility, Difficulty walking, Decreased range of motion, Improper body mechanics  Visit Diagnosis: Muscle weakness (generalized)  Bilateral low back pain, unspecified chronicity, unspecified whether sciatica present     Problem List Patient Active Problem List   Diagnosis Date Noted  . S/P arthroscopy of right knee 12/31/17     12:32 PM, 07/22/19 Josue Hector PT DPT  Physical Therapist with [facility]  [phone] [place] [address] [place], [place], [address] Phone: [phone]   Fax:  [phone]  Name: Courtney Patterson MRN: [id number] Date of Birth: Jul 16, 1980

## 2019-07-25 ENCOUNTER — Encounter (HOSPITAL_COMMUNITY): Payer: Self-pay | Admitting: Physical Therapy

## 2019-07-25 ENCOUNTER — Ambulatory Visit (HOSPITAL_COMMUNITY): Payer: Medicare Other | Admitting: Physical Therapy

## 2019-07-25 ENCOUNTER — Other Ambulatory Visit: Payer: Self-pay

## 2019-07-25 DIAGNOSIS — M6281 Muscle weakness (generalized): Secondary | ICD-10-CM

## 2019-07-25 DIAGNOSIS — M545 Low back pain, unspecified: Secondary | ICD-10-CM

## 2019-07-25 NOTE — Therapy (Signed)
[place], [place], [address] Phone: [phone]   Fax:  [phone]  Physical Therapy Treatment/ Progress Note  Patient Details  Name: Courtney Patterson MRN: [id number] Date of Birth: 20-Nov-1980 Referring Provider (PT): Sanjuana Kava MD    Encounter Date: 07/25/2019   Progress Note Reporting Period 06/20/19 to 07/15/19  See note below for Objective Data and Assessment of Progress/Goals.       PT End of Session - 07/25/19 1309    Visit Number  5    Number of Visits  18    Date for PT Re-Evaluation  08/05/19    Authorization Type  Medicare Part A (80/20)/ Medicaid secondary    Progress Note Due on Visit  15    PT Start Time  1310   arrived late   PT Stop Time  1345    PT Time Calculation (min)  35 min    Activity Tolerance  Patient limited by pain    Behavior During Therapy  [facility] for tasks assessed/performed       Past Medical History:  Diagnosis Date  . Anxiety   . Arthritis   . Depression   . Knee pain, chronic     Past Surgical History:  Procedure Laterality Date  . ABDOMINAL HYSTERECTOMY    . DENTAL RESTORATION/EXTRACTION WITH X-RAY    . KNEE ARTHROSCOPY Right 01/25/2015   Procedure: ARTHROSCOPY KNEE;  Surgeon: Sanjuana Kava, MD;  Location: [facility];  Service: Orthopedics;  Laterality: Right;  . KNEE ARTHROSCOPY WITH MEDIAL MENISECTOMY Right 01/25/2015   Procedure: KNEE ARTHROSCOPY WITH PARTIAL MEDIAL MENISECTOMY;  Surgeon: Sanjuana Kava, MD;  Location: [facility];  Service: Orthopedics;  Laterality: Right;  . KNEE ARTHROSCOPY WITH MEDIAL MENISECTOMY Right 12/31/2017   Procedure: KNEE ARTHROSCOPY WITH MEDIAL MENISECTOMY;  Surgeon: Carole Civil, MD;  Location: [facility];  Service: Orthopedics;  Laterality: Right;  . TUBAL LIGATION      There were no vitals filed for this visit.  Subjective Assessment - 07/25/19 1312    Subjective  Patient says she is hurting today. Reports ongoing pain in LT low  back. Says she had trouble moving her LT leg last night and had to help lift it with her hands. Says she feels about 50% improvement since starting therapy.    Limitations  Sitting;Lifting;Standing;Walking;House hold activities    How long can you sit comfortably?  30 minutes    How long can you stand comfortably?  no limit    How long can you walk comfortably?  no problem, feels better moving    Patient Stated Goals  try to get rid of the pain    Currently in Pain?  Yes    Pain Score  7     Pain Location  Back    Pain Orientation  Posterior;Left    Pain Descriptors / Indicators  Squeezing;Tightness    Pain Type  Chronic pain    Pain Onset  More than a month ago    Pain Frequency  Constant    Aggravating Factors   prolonged sitting    Pain Relieving Factors  positioning, meds    Effect of Pain on Daily Activities  Limits         [facility] PT Assessment - 07/25/19 0001      Assessment   Medical Diagnosis  LT side low back pain     Referring Provider (PT)  Sanjuana Kava MD     Onset Date/Surgical  Date  --   chronic, > 5 years   Next MD Visit  07/26/19    Prior Therapy  Not for lumbar      Precautions   Precautions  None      Restrictions   Weight Bearing Restrictions  No      Balance Screen   Has the patient fallen in the past 6 months  No      [place] residence      Prior Function   Level of Independence  Independent      Cognition   Overall Cognitive Status  Within Functional Limits for tasks assessed      Observation/Other Assessments   Focus on Therapeutic Outcomes (FOTO)   67% limited   was 60% limited     AROM   Lumbar Flexion  50% limited   was 90% limited   Lumbar Extension  90% limited   was 100% limited    Lumbar - Right Side Bend  50% limited   was 75%   Lumbar - Left Side Bend  50% limited   was 75%   Lumbar - Right Rotation  25% limited    Lumbar - Left Rotation  25% limited      Strength   Right Hip  Flexion  4/5   was 3+   Right Hip Extension  4-/5   was 3-   Right Hip ABduction  4/5   was 3+   Left Hip Flexion  4-/5   was 3-   Left Hip Extension  3-/5    Left Hip ABduction  3-/5    Right Knee Flexion  4+/5    Right Knee Extension  5/5    Left Knee Flexion  4/5    Left Knee Extension  4+/5    Right Ankle Dorsiflexion  5/5    Left Ankle Dorsiflexion  4+/5      Palpation   Palpation comment  --   Mod tenderness to palpation about LT lumbar paraspinals (L1)     Transfers   Five time sit to stand comments   42.5 sec with no UE      Static Standing Balance   Static Standing Balance -  Activities   Single Leg Stance - Right Leg;Single Leg Stance - Left Leg    Static Standing - Comment/# of Minutes  15 sec; 9 sec                             PT Education - 07/25/19 1314    Education Details  on reassessment and POC    Person(s) Educated  Patient    Methods  Explanation    Comprehension  Verbalized understanding       PT Short Term Goals - 07/25/19 1333      PT SHORT TERM GOAL #1   Title  Patient will be independent with initial HEP to improve functional outcomes    Baseline  Reports compliance    Time  3    Period  Weeks    Status  Achieved    Target Date  07/15/19      PT SHORT TERM GOAL #2   Title  Patient will report at least 40% overall improvement in subjective complaint to indicate improvement in ability to perform ADLs.    Baseline  Reports 50% currently    Time  3    Period  Weeks    Status  Achieved    Target Date  07/15/19        PT Long Term Goals - 07/25/19 1334      PT LONG TERM GOAL #1   Title  Patient will report at least 70% overall improvement in subjective complaint to indicate improvement in ability to perform ADLs.    Baseline  currently 50%    Time  6    Period  Weeks    Status  On-going      PT LONG TERM GOAL #2   Title  Patient will be able to perform stand x 5 in < 15 seconds to demonstrate improvement in  functional mobility and reduced risk for falls.    Baseline  Current 42.5 sec with no UE    Time  6    Period  Weeks    Status  On-going      PT LONG TERM GOAL #3   Title  Patient will improve FOTO score to <50% to indicate improvement in functional outcomes    Baseline  Current: 67% limited    Time  6    Period  Weeks    Status  On-going      PT LONG TERM GOAL #4   Title  Patient will have equal to or > 4/5 MMT throughout BLE to improve ability to perform functional mobility, stair ambulation and ADLs.    Baseline  See MMT    Time  6    Period  Weeks    Status  On-going      PT LONG TERM GOAL #5   Title  Patient will improve lumbar flexion and extension by 25% to improve ability to perform functional mobility and ADLs.    Baseline  See AROM objectives    Time  6    Period  Weeks    Status  Partially Met            Plan - 07/25/19 1342    Clinical Impression Statement  Patient is making slow progress toward therapy goals. Patient currently with 2/2 short term and 1/5 long term goals met/ partially met. Patient shows some improvement in AROM and RT LE strength, but continues to be limited by LT side weakness, increased muscle restrictions and increased pain with activity, which continues to negatively impact functional ability. Patient will continue to benefit from skilled therapy services to address remaining deficits to reduce pain and improve LOF with ADLs and functional mobility.    Personal Factors and Comorbidities  Time since onset of injury/illness/exacerbation    Examination-Activity Limitations  Bathing;Sit;Sleep;Bed Mobility;Bend;Squat;Stairs;Stand;Carry;Transfers;Lift;Locomotion Level;Dressing;Hygiene/Grooming    Examination-Participation Restrictions  Cleaning;Community Activity;Driving;Laundry    Stability/Clinical Decision Making  Stable/Uncomplicated    Rehab Potential  Good    PT Frequency  3x / week    PT Duration  6 weeks    PT Treatment/Interventions   ADLs/Self Care Home Management;Aquatic Therapy;Biofeedback;Cryotherapy;Electrical Stimulation;Iontophoresis 71m/ml Dexamethasone;Moist Heat;Traction;Balance training;Manual techniques;Therapeutic exercise;Therapeutic activities;Functional mobility training;Stair training;Orthotic Fit/Training;Gait training;DME Instruction;Fluidtherapy;Contrast Bath;Patient/family education;Ultrasound;Parrafin;Neuromuscular re-education;Compression bandaging;Passive range of motion;Spinal Manipulations;Joint Manipulations;Dry needling;Energy conservation;Splinting;Taping;Vasopneumatic Device    PT Next Visit Plan  Continue to progress lumabr mobility and core/ hip strengthening as tolerated. Manuals PRN to reduce pain and muscle restriction about lumbar    PT Home Exercise Plan  06/27/19: ab set, clam, isometric hip abd, diaphragm breathing    Consulted and Agree with Plan of Care  Patient       Patient will benefit from skilled therapeutic intervention  in order to improve the following deficits and impairments:  Abnormal gait, Decreased endurance, Hypomobility, Pain, Decreased strength, Decreased activity tolerance, Decreased balance, Decreased mobility, Difficulty walking, Decreased range of motion, Improper body mechanics  Visit Diagnosis: Muscle weakness (generalized)  Bilateral low back pain, unspecified chronicity, unspecified whether sciatica present     Problem List Patient Active Problem List   Diagnosis Date Noted  . S/P arthroscopy of right knee 12/31/17     1:46 PM, 07/25/19 Josue Hector PT DPT  Physical Therapist with [facility]  [phone] [place] [address] [place], [place], [address] Phone: [phone]   Fax:  [phone]  Name: Courtney Patterson MRN: [id number] Date of Birth: 05-18-1980

## 2019-07-26 ENCOUNTER — Ambulatory Visit (INDEPENDENT_AMBULATORY_CARE_PROVIDER_SITE_OTHER): Payer: Medicare Other | Admitting: Orthopaedic Surgery

## 2019-07-26 ENCOUNTER — Encounter: Payer: Self-pay | Admitting: Orthopaedic Surgery

## 2019-07-26 ENCOUNTER — Telehealth (HOSPITAL_COMMUNITY): Payer: Self-pay | Admitting: Physical Therapy

## 2019-07-26 VITALS — Ht 74.0 in | Wt 176.0 lb

## 2019-07-26 DIAGNOSIS — M545 Low back pain, unspecified: Secondary | ICD-10-CM

## 2019-07-26 DIAGNOSIS — F17210 Nicotine dependence, cigarettes, uncomplicated: Secondary | ICD-10-CM

## 2019-07-26 DIAGNOSIS — M79605 Pain in left leg: Secondary | ICD-10-CM

## 2019-07-26 MED ORDER — HYDROCODONE-ACETAMINOPHEN 5-325 MG PO TABS: ORAL_TABLET | ORAL | 0 refills | Status: DC

## 2019-07-26 NOTE — Patient Instructions (Signed)
Steps to Quit Smoking Smoking tobacco is the leading cause of preventable death. It can affect almost every organ in the body. Smoking puts you and people around you at risk for many serious, long-lasting (chronic) diseases. Quitting smoking can be hard, but it is one of the best things that you can do for your health. It is never too late to quit. How do I get ready to quit? When you decide to quit smoking, make a plan to help you succeed. Before you quit:  Pick a date to quit. Set a date within the next 2 weeks to give you time to prepare.  Write down the reasons why you are quitting. Keep this list in places where you will see it often.  Tell your family, friends, and co-workers that you are quitting. Their support is important.  Talk with your doctor about the choices that may help you quit.  Find out if your health insurance will pay for these treatments.  Know the people, places, things, and activities that make you want to smoke (triggers). Avoid them. What first steps can I take to quit smoking?  Throw away all cigarettes at home, at work, and in your car.  Throw away the things that you use when you smoke, such as ashtrays and lighters.  Clean your car. Make sure to empty the ashtray.  Clean your home, including curtains and carpets. What can I do to help me quit smoking? Talk with your doctor about taking medicines and seeing a counselor at the same time. You are more likely to succeed when you do both.  If you are pregnant or breastfeeding, talk with your doctor about counseling or other ways to quit smoking. Do not take medicine to help you quit smoking unless your doctor tells you to do so. To quit smoking: Quit right away  Quit smoking totally, instead of slowly cutting back on how much you smoke over a period of time.  Go to counseling. You are more likely to quit if you go to counseling sessions regularly. Take medicine You may take medicines to help you quit. Some  medicines need a prescription, and some you can buy over-the-counter. Some medicines may contain a drug called nicotine to replace the nicotine in cigarettes. Medicines may:  Help you to stop having the desire to smoke (cravings).  Help to stop the problems that come when you stop smoking (withdrawal symptoms). Your doctor may ask you to use:  Nicotine patches, gum, or lozenges.  Nicotine inhalers or sprays.  Non-nicotine medicine that is taken by mouth. Find resources Find resources and other ways to help you quit smoking and remain smoke-free after you quit. These resources are most helpful when you use them often. They include:  Online chats with a counselor.  Phone quitlines.  Printed self-help materials.  Support groups or group counseling.  Text messaging programs.  Mobile phone apps. Use apps on your mobile phone or tablet that can help you stick to your quit plan. There are many free apps for mobile phones and tablets as well as websites. Examples include Quit Guide from the CDC and smokefree.gov  What things can I do to make it easier to quit?   Talk to your family and friends. Ask them to support and encourage you.  Call a phone quitline (1-800-QUIT-NOW), reach out to support groups, or work with a counselor.  Ask people who smoke to not smoke around you.  Avoid places that make you want to smoke,   such as: ? Bars. ? Parties. ? Smoke-break areas at work.  Spend time with people who do not smoke.  Lower the stress in your life. Stress can make you want to smoke. Try these things to help your stress: ? Getting regular exercise. ? Doing deep-breathing exercises. ? Doing yoga. ? Meditating. ? Doing a body scan. To do this, close your eyes, focus on one area of your body at a time from head to toe. Notice which parts of your body are tense. Try to relax the muscles in those areas. How will I feel when I quit smoking? Day 1 to 3 weeks Within the first 24 hours,  you may start to have some problems that come from quitting tobacco. These problems are very bad 2-3 days after you quit, but they do not often last for more than 2-3 weeks. You may get these symptoms:  Mood swings.  Feeling restless, nervous, angry, or annoyed.  Trouble concentrating.  Dizziness.  Strong desire for high-sugar foods and nicotine.  Weight gain.  Trouble pooping (constipation).  Feeling like you may vomit (nausea).  Coughing or a sore throat.  Changes in how the medicines that you take for other issues work in your body.  Depression.  Trouble sleeping (insomnia). Week 3 and afterward After the first 2-3 weeks of quitting, you may start to notice more positive results, such as:  Better sense of smell and taste.  Less coughing and sore throat.  Slower heart rate.  Lower blood pressure.  Clearer skin.  Better breathing.  Fewer sick days. Quitting smoking can be hard. Do not give up if you fail the first time. Some people need to try a few times before they succeed. Do your best to stick to your quit plan, and talk with your doctor if you have any questions or concerns. Summary  Smoking tobacco is the leading cause of preventable death. Quitting smoking can be hard, but it is one of the best things that you can do for your health.  When you decide to quit smoking, make a plan to help you succeed.  Quit smoking right away, not slowly over a period of time.  When you start quitting, seek help from your doctor, family, or friends. This information is not intended to replace advice given to you by your health care provider. Make sure you discuss any questions you have with your health care provider. Document Revised: 11/19/2018 Document Reviewed: 05/15/2018 [employer] Patient Education  2020 [employer]  

## 2019-07-26 NOTE — Progress Notes (Signed)
Patient Courtney Patterson, female DOB:05/04/1980, 39 y.o. [id number]  Chief Complaint  Patient presents with  . Back Pain    HPI  Courtney Patterson is a 39 y.o. female who has chronic lower back pain.  She has been going to PT and is making good progress. I have reviewed the notes.  She needs more PT.  She is taking her medicine and doing her exercises.   Body mass index is 22.6 kg/m.  ROS  Review of Systems  Constitutional: Positive for activity change.  Musculoskeletal: Positive for arthralgias, back pain, gait problem, joint swelling and neck pain.  Psychiatric/Behavioral: The patient is nervous/anxious.   All other systems reviewed and are negative.   All other systems reviewed and are negative.  The following is a summary of the past history medically, past history surgically, known current medicines, social history and family history.  This information is gathered electronically by the computer from prior information and documentation.  I review this each visit and have found including this information at this point in the chart is beneficial and informative.    Past Medical History:  Diagnosis Date  . Anxiety   . Arthritis   . Depression   . Knee pain, chronic     Past Surgical History:  Procedure Laterality Date  . ABDOMINAL HYSTERECTOMY    . DENTAL RESTORATION/EXTRACTION WITH X-RAY    . KNEE ARTHROSCOPY Right 01/25/2015   Procedure: ARTHROSCOPY KNEE;  Surgeon: Sanjuana Kava, MD;  Location: [facility];  Service: Orthopedics;  Laterality: Right;  . KNEE ARTHROSCOPY WITH MEDIAL MENISECTOMY Right 01/25/2015   Procedure: KNEE ARTHROSCOPY WITH PARTIAL MEDIAL MENISECTOMY;  Surgeon: Sanjuana Kava, MD;  Location: [facility];  Service: Orthopedics;  Laterality: Right;  . KNEE ARTHROSCOPY WITH MEDIAL MENISECTOMY Right 12/31/2017   Procedure: KNEE ARTHROSCOPY WITH MEDIAL MENISECTOMY;  Surgeon: Carole Civil, MD;  Location: [facility];  Service: Orthopedics;  Laterality: Right;   . TUBAL LIGATION      Family History  Problem Relation Age of Onset  . Cancer Mother   . Cancer Father     Social History Social History   Tobacco Use  . Smoking status: Current Every Day Smoker    Packs/day: 0.50    Years: 18.00    Pack years: 9.00  . Smokeless tobacco: Never Used  Substance Use Topics  . Alcohol use: No    Alcohol/week: 0.0 standard drinks    Comment: remote marjuana  . Drug use: Yes    Types: Marijuana    Comment: last used 03/31/2019    No Known Allergies  Current Outpatient Medications  Medication Sig Dispense Refill  . clonazePAM (KLONOPIN) 0.5 MG tablet Take 0.5 mg by mouth 2 (two) times daily.    Marland Kitchen HYDROcodone-acetaminophen (NORCO/VICODIN) 5-325 MG tablet One tablet every six hours for pain.  Limit 7 days. 28 tablet 0  . naproxen (NAPROSYN) 500 MG tablet Take 1 tablet (500 mg total) by mouth 2 (two) times daily. 30 tablet 0  . sertraline (ZOLOFT) 50 MG tablet Take 50 mg by mouth daily.  2  . traZODone (DESYREL) 100 MG tablet Take 100 mg by mouth at bedtime.     No current facility-administered medications for this visit.     Physical Exam  Height 6\' 2"  (1.88 m), weight 176 lb (79.8 kg), last menstrual period 05/23/2015.  Constitutional: overall normal hygiene, normal nutrition, well developed, normal grooming, normal body habitus. Assistive device:none  Musculoskeletal: gait and station Limp none, muscle tone  and strength are normal, no tremors or atrophy is present.  .  Neurological: coordination overall normal.  Deep tendon reflex/nerve stretch intact.  Sensation normal.  Cranial nerves II-XII intact.   Skin:   Normal overall no scars, lesions, ulcers or rashes. No psoriasis.  Psychiatric: Alert and oriented x 3.  Recent memory intact, remote memory unclear.  Normal mood and affect. Well groomed.  Good eye contact.  Cardiovascular: overall no swelling, no varicosities, no edema bilaterally, normal temperatures of the legs and arms,  no clubbing, cyanosis and good capillary refill.  Spine/Pelvis examination:  Inspection:  Overall, sacoiliac joint benign and hips nontender; without crepitus or defects.   Thoracic spine inspection: Alignment normal without kyphosis present   Lumbar spine inspection:  Alignment  with normal lumbar lordosis, without scoliosis apparent.   Thoracic spine palpation:  without tenderness of spinal processes   Lumbar spine palpation: without tenderness of lumbar area; without tightness of lumbar muscles    Range of Motion:   Lumbar flexion, forward flexion is normal without pain or tenderness    Lumbar extension is full without pain or tenderness   Left lateral bend is normal without pain or tenderness   Right lateral bend is normal without pain or tenderness   Straight leg raising is normal  Strength & tone: normal   Stability overall normal stability  Lymphatic: palpation is normal.  All other systems reviewed and are negative   The patient has been educated about the nature of the problem(s) and counseled on treatment options.  The patient appeared to understand what I have discussed and is in agreement with it.  Encounter Diagnoses  Name Primary?  . Lumbar pain with radiation down left leg Yes  . Nicotine dependence, cigarettes, uncomplicated     PLAN Call if any problems.  Precautions discussed.  Continue current medications.   Return to clinic 3 weeks   I have reviewed the [facility] Controlled Substance Reporting System web site prior to prescribing narcotic medicine for this patient.   Electronically Signed Darreld Mclean, MD 5/18/202110:24 AM

## 2019-07-26 NOTE — Telephone Encounter (Signed)
She tried to get a ride and could not - she will be here on Friday afternoon

## 2019-07-27 ENCOUNTER — Ambulatory Visit (HOSPITAL_COMMUNITY): Payer: Medicare Other | Admitting: Physical Therapy

## 2019-07-29 ENCOUNTER — Ambulatory Visit (HOSPITAL_COMMUNITY): Payer: Medicare Other | Admitting: Physical Therapy

## 2019-07-29 ENCOUNTER — Other Ambulatory Visit: Payer: Self-pay

## 2019-07-29 ENCOUNTER — Encounter (HOSPITAL_COMMUNITY): Payer: Self-pay | Admitting: Physical Therapy

## 2019-07-29 DIAGNOSIS — M6281 Muscle weakness (generalized): Secondary | ICD-10-CM

## 2019-07-29 DIAGNOSIS — M545 Low back pain, unspecified: Secondary | ICD-10-CM

## 2019-07-29 NOTE — Therapy (Signed)
[facility] [address] [place], [place], [address] Phone: [phone]   Fax:  [phone]  Physical Therapy Treatment  Patient Details  Name: Courtney Patterson MRN: [id number] Date of Birth: Jun 04, 1980 Referring Provider (PT): Sanjuana Kava MD    Encounter Date: 07/29/2019  PT End of Session - 07/29/19 1352    Visit Number  6    Number of Visits  18    Date for PT Re-Evaluation  08/05/19    Authorization Type  Medicare Part A (80/20)/ Medicaid secondary    Progress Note Due on Visit  15    PT Start Time  1350    PT Stop Time  1428    PT Time Calculation (min)  38 min    Activity Tolerance  Patient limited by pain    Behavior During Therapy  [facility] for tasks assessed/performed       Past Medical History:  Diagnosis Date  . Anxiety   . Arthritis   . Depression   . Knee pain, chronic     Past Surgical History:  Procedure Laterality Date  . ABDOMINAL HYSTERECTOMY    . DENTAL RESTORATION/EXTRACTION WITH X-RAY    . KNEE ARTHROSCOPY Right 01/25/2015   Procedure: ARTHROSCOPY KNEE;  Surgeon: Sanjuana Kava, MD;  Location: [facility];  Service: Orthopedics;  Laterality: Right;  . KNEE ARTHROSCOPY WITH MEDIAL MENISECTOMY Right 01/25/2015   Procedure: KNEE ARTHROSCOPY WITH PARTIAL MEDIAL MENISECTOMY;  Surgeon: Sanjuana Kava, MD;  Location: [facility];  Service: Orthopedics;  Laterality: Right;  . KNEE ARTHROSCOPY WITH MEDIAL MENISECTOMY Right 12/31/2017   Procedure: KNEE ARTHROSCOPY WITH MEDIAL MENISECTOMY;  Surgeon: Carole Civil, MD;  Location: [facility];  Service: Orthopedics;  Laterality: Right;  . TUBAL LIGATION      There were no vitals filed for this visit.  Subjective Assessment - 07/29/19 1351    Subjective  Patient reported that her left knee has been bothering her a lot recently.    Limitations  Sitting;Lifting;Standing;Walking;House hold activities    How long can you sit comfortably?  30 minutes    How long can you stand  comfortably?  no limit    How long can you walk comfortably?  no problem, feels better moving    Patient Stated Goals  try to get rid of the pain    Currently in Pain?  Yes    Pain Score  6     Pain Location  Knee    Pain Orientation  Left    Pain Descriptors / Indicators  Aching    Pain Onset  More than a month ago         [facility] PT Assessment - 07/29/19 0001      Assessment   Medical Diagnosis  LT side low back pain     Referring Provider (PT)  Sanjuana Kava MD       Palpation   Palpation comment  TTP around left knee on the medial side of joint line                    [facility] Adult PT Treatment/Exercise - 07/29/19 0001      Lumbar Exercises: Stretches   Other Lumbar Stretch Exercise  LTR x10 slow and easy for pain relief      Lumbar Exercises: Supine   Ab Set  5 seconds;15 reps    Glut Set  10 reps;5 seconds    Bridge  10 reps;5 seconds  Bridge Limitations  2 sets. LEs supported on physioball    Other Supine Lumbar Exercises  Quad set bil LE for neural cross-talk 5'' holds x 6 each. Discontinued due to pain in LT knee.     Other Supine Lumbar Exercises  LEs on small green physioball 90/90 2 minutes lumbar tilt neutral alignment. Hamstring set into green physioball x 5 prior to pain in LT knee discontinuing.       Lumbar Exercises: Sidelying   Clam  Right;Left;10 reps               PT Short Term Goals - 07/25/19 1333      PT SHORT TERM GOAL #1   Title  Patient will be independent with initial HEP to improve functional outcomes    Baseline  Reports compliance    Time  3    Period  Weeks    Status  Achieved    Target Date  07/15/19      PT SHORT TERM GOAL #2   Title  Patient will report at least 40% overall improvement in subjective complaint to indicate improvement in ability to perform ADLs.    Baseline  Reports 50% currently    Time  3    Period  Weeks    Status  Achieved    Target Date  07/15/19        PT Long Term Goals - 07/25/19  1334      PT LONG TERM GOAL #1   Title  Patient will report at least 70% overall improvement in subjective complaint to indicate improvement in ability to perform ADLs.    Baseline  currently 50%    Time  6    Period  Weeks    Status  On-going      PT LONG TERM GOAL #2   Title  Patient will be able to perform stand x 5 in < 15 seconds to demonstrate improvement in functional mobility and reduced risk for falls.    Baseline  Current 42.5 sec with no UE    Time  6    Period  Weeks    Status  On-going      PT LONG TERM GOAL #3   Title  Patient will improve FOTO score to <50% to indicate improvement in functional outcomes    Baseline  Current: 67% limited    Time  6    Period  Weeks    Status  On-going      PT LONG TERM GOAL #4   Title  Patient will have equal to or > 4/5 MMT throughout BLE to improve ability to perform functional mobility, stair ambulation and ADLs.    Baseline  See MMT    Time  6    Period  Weeks    Status  On-going      PT LONG TERM GOAL #5   Title  Patient will improve lumbar flexion and extension by 25% to improve ability to perform functional mobility and ADLs.    Baseline  See AROM objectives    Time  6    Period  Weeks    Status  Partially Met            Plan - 07/29/19 1537    Clinical Impression Statement  Patient with primary deficit of left knee pain this session. Tenderness noted in medial aspect of left knee. Patient very pain limited throughout session. Modified exercises as able, and also tried to encourage graded exposure  to knee movement. This session did add use of green physioball for positioning with patient reporting a reduction in back discomfort with this.    Personal Factors and Comorbidities  Time since onset of injury/illness/exacerbation    Examination-Activity Limitations  Bathing;Sit;Sleep;Bed Mobility;Bend;Squat;Stairs;Stand;Carry;Transfers;Lift;Locomotion Level;Dressing;Hygiene/Grooming    Examination-Participation  Restrictions  Cleaning;Community Activity;Driving;Laundry    Stability/Clinical Decision Making  Stable/Uncomplicated    Rehab Potential  Good    PT Frequency  3x / week    PT Duration  6 weeks    PT Treatment/Interventions  ADLs/Self Care Home Management;Aquatic Therapy;Biofeedback;Cryotherapy;Electrical Stimulation;Iontophoresis 56m/ml Dexamethasone;Moist Heat;Traction;Balance training;Manual techniques;Therapeutic exercise;Therapeutic activities;Functional mobility training;Stair training;Orthotic Fit/Training;Gait training;DME Instruction;Fluidtherapy;Contrast Bath;Patient/family education;Ultrasound;Parrafin;Neuromuscular re-education;Compression bandaging;Passive range of motion;Spinal Manipulations;Joint Manipulations;Dry needling;Energy conservation;Splinting;Taping;Vasopneumatic Device    PT Next Visit Plan  Trial TENS unit with exercise performance. Continue to progress lumabr mobility and core/ hip strengthening as tolerated.    PT Home Exercise Plan  06/27/19: ab set, clam, isometric hip abd, diaphragm breathing    Consulted and Agree with Plan of Care  Patient       Patient will benefit from skilled therapeutic intervention in order to improve the following deficits and impairments:  Abnormal gait, Decreased endurance, Hypomobility, Pain, Decreased strength, Decreased activity tolerance, Decreased balance, Decreased mobility, Difficulty walking, Decreased range of motion, Improper body mechanics  Visit Diagnosis: Muscle weakness (generalized)  Bilateral low back pain, unspecified chronicity, unspecified whether sciatica present     Problem List Patient Active Problem List   Diagnosis Date Noted  . S/P arthroscopy of right knee 12/31/17    MClarene CritchleyPT, DPT 3:39 PM, 07/29/19 [address] [address]Phone: [phone]  Fax:  [phone] Name: Courtney BIDWELLMRN: [id number] of  Birth: [birth year]

## 2019-08-01 ENCOUNTER — Encounter (HOSPITAL_COMMUNITY): Payer: Self-pay | Admitting: Physical Therapy

## 2019-08-01 ENCOUNTER — Other Ambulatory Visit: Payer: Self-pay

## 2019-08-01 ENCOUNTER — Ambulatory Visit (HOSPITAL_COMMUNITY): Payer: Medicare Other | Admitting: Physical Therapy

## 2019-08-01 DIAGNOSIS — M6281 Muscle weakness (generalized): Secondary | ICD-10-CM

## 2019-08-01 DIAGNOSIS — M545 Low back pain, unspecified: Secondary | ICD-10-CM

## 2019-08-01 NOTE — Therapy (Signed)
[facility] [address] [place], [place], [address] Phone: [phone]   Fax:  [phone]  Physical Therapy Treatment  Patient Details  Name: Courtney Patterson MRN: [id number] Date of Birth: 01/24/1981 Referring Provider (PT): Sanjuana Kava MD    Encounter Date: 08/01/2019  PT End of Session - 08/01/19 1348    Visit Number  7    Number of Visits  18    Date for PT Re-Evaluation  08/05/19    Authorization Type  Medicare Part A (80/20)/ Medicaid secondary    Progress Note Due on Visit  15    PT Start Time  1316   patient arrived late   PT Stop Time  1341    PT Time Calculation (min)  25 min    Activity Tolerance  Patient limited by pain    Behavior During Therapy  [facility] for tasks assessed/performed       Past Medical History:  Diagnosis Date  . Anxiety   . Arthritis   . Depression   . Knee pain, chronic     Past Surgical History:  Procedure Laterality Date  . ABDOMINAL HYSTERECTOMY    . DENTAL RESTORATION/EXTRACTION WITH X-RAY    . KNEE ARTHROSCOPY Right 01/25/2015   Procedure: ARTHROSCOPY KNEE;  Surgeon: Sanjuana Kava, MD;  Location: [facility];  Service: Orthopedics;  Laterality: Right;  . KNEE ARTHROSCOPY WITH MEDIAL MENISECTOMY Right 01/25/2015   Procedure: KNEE ARTHROSCOPY WITH PARTIAL MEDIAL MENISECTOMY;  Surgeon: Sanjuana Kava, MD;  Location: [facility];  Service: Orthopedics;  Laterality: Right;  . KNEE ARTHROSCOPY WITH MEDIAL MENISECTOMY Right 12/31/2017   Procedure: KNEE ARTHROSCOPY WITH MEDIAL MENISECTOMY;  Surgeon: Carole Civil, MD;  Location: [facility];  Service: Orthopedics;  Laterality: Right;  . TUBAL LIGATION      There were no vitals filed for this visit.  Subjective Assessment - 08/01/19 1319    Subjective  Patient reported that she had a rough weekend. Patient reported that her back and knee were hurting and had a stabbing feeling.    Limitations  Sitting;Lifting;Standing;Walking;House hold activities    How  long can you sit comfortably?  30 minutes    How long can you stand comfortably?  no limit    How long can you walk comfortably?  no problem, feels better moving    Patient Stated Goals  try to get rid of the pain    Currently in Pain?  Yes    Pain Score  7     Pain Location  Back    Pain Orientation  Left    Pain Descriptors / Indicators  Aching    Pain Type  Chronic pain    Pain Onset  More than a month ago                        [facility] Adult PT Treatment/Exercise - 08/01/19 0001      Lumbar Exercises: Supine   Ab Set  5 seconds;15 reps    AB Set Limitations  LEs elevated on small green physioball   With TENS   Glut Set  10 reps;5 seconds      Lumbar Exercises: Sidelying   Clam  Right;Left;10 reps      Lumbar Exercises: Prone   Other Prone Lumbar Exercises  Hamstring curl on the LT LE x10   with TENS     Modalities   Modalities  Electrical Stimulation      Electrical  Stimulation   Electrical Stimulation Location  Left lumbar paraspinals    Electrical Stimulation Action  TENS unit for chronic pain relief    Electrical Stimulation Parameters  15 minutes: Amplitude 4, Pulse width 150 microseconds, M, C, Frequency 6 Hz.     Electrical Stimulation Goals  Pain               PT Short Term Goals - 07/25/19 1333      PT SHORT TERM GOAL #1   Title  Patient will be independent with initial HEP to improve functional outcomes    Baseline  Reports compliance    Time  3    Period  Weeks    Status  Achieved    Target Date  07/15/19      PT SHORT TERM GOAL #2   Title  Patient will report at least 40% overall improvement in subjective complaint to indicate improvement in ability to perform ADLs.    Baseline  Reports 50% currently    Time  3    Period  Weeks    Status  Achieved    Target Date  07/15/19        PT Long Term Goals - 07/25/19 1334      PT LONG TERM GOAL #1   Title  Patient will report at least 70% overall improvement in subjective  complaint to indicate improvement in ability to perform ADLs.    Baseline  currently 50%    Time  6    Period  Weeks    Status  On-going      PT LONG TERM GOAL #2   Title  Patient will be able to perform stand x 5 in < 15 seconds to demonstrate improvement in functional mobility and reduced risk for falls.    Baseline  Current 42.5 sec with no UE    Time  6    Period  Weeks    Status  On-going      PT LONG TERM GOAL #3   Title  Patient will improve FOTO score to <50% to indicate improvement in functional outcomes    Baseline  Current: 67% limited    Time  6    Period  Weeks    Status  On-going      PT LONG TERM GOAL #4   Title  Patient will have equal to or > 4/5 MMT throughout BLE to improve ability to perform functional mobility, stair ambulation and ADLs.    Baseline  See MMT    Time  6    Period  Weeks    Status  On-going      PT LONG TERM GOAL #5   Title  Patient will improve lumbar flexion and extension by 25% to improve ability to perform functional mobility and ADLs.    Baseline  See AROM objectives    Time  6    Period  Weeks    Status  Partially Met            Plan - 08/01/19 1348    Clinical Impression Statement  Patient arrived late which limited session overall. Focused on abdominal strengthening and LE strengthening this session. Also trialed TENS unit in conjunction with exercises for 15 minutes this session. Patient reported that it felt like her back pain was improved following using the TENS, however, at 15 minutes she requested to stop the TENS due to increased discomfort. Discontinued TENS following request.    Personal Factors and Comorbidities  Time since onset of injury/illness/exacerbation    Examination-Activity Limitations  Bathing;Sit;Sleep;Bed Mobility;Bend;Squat;Stairs;Stand;Carry;Transfers;Lift;Locomotion Level;Dressing;Hygiene/Grooming    Examination-Participation Restrictions  Cleaning;Community Activity;Driving;Laundry     Stability/Clinical Decision Making  Stable/Uncomplicated    Rehab Potential  Good    PT Frequency  3x / week    PT Duration  6 weeks    PT Treatment/Interventions  ADLs/Self Care Home Management;Aquatic Therapy;Biofeedback;Cryotherapy;Electrical Stimulation;Iontophoresis 31m/ml Dexamethasone;Moist Heat;Traction;Balance training;Manual techniques;Therapeutic exercise;Therapeutic activities;Functional mobility training;Stair training;Orthotic Fit/Training;Gait training;DME Instruction;Fluidtherapy;Contrast Bath;Patient/family education;Ultrasound;Parrafin;Neuromuscular re-education;Compression bandaging;Passive range of motion;Spinal Manipulations;Joint Manipulations;Dry needling;Energy conservation;Splinting;Taping;Vasopneumatic Device    PT Next Visit Plan  F/U on TENS unit. Possible Re-assessment. Continue to progress lumabr mobility and core/ hip strengthening as tolerated.    PT Home Exercise Plan  06/27/19: ab set, clam, isometric hip abd, diaphragm breathing    Consulted and Agree with Plan of Care  Patient       Patient will benefit from skilled therapeutic intervention in order to improve the following deficits and impairments:  Abnormal gait, Decreased endurance, Hypomobility, Pain, Decreased strength, Decreased activity tolerance, Decreased balance, Decreased mobility, Difficulty walking, Decreased range of motion, Improper body mechanics  Visit Diagnosis: Muscle weakness (generalized)  Bilateral low back pain, unspecified chronicity, unspecified whether sciatica present     Problem List Patient Active Problem List   Diagnosis Date Noted  . S/P arthroscopy of right knee 12/31/17    MClarene CritchleyPT, DPT 1:50 PM, 08/01/19 [address] [address]Phone: [phone]  Fax:  [phone] Name: CCORTLYN CANNELLMRN: [id number] of Birth: 506-Apr-1982

## 2019-08-02 ENCOUNTER — Telehealth: Payer: Self-pay | Admitting: Orthopaedic Surgery

## 2019-08-02 MED ORDER — HYDROCODONE-ACETAMINOPHEN 5-325 MG PO TABS: ORAL_TABLET | ORAL | 0 refills | Status: DC

## 2019-08-02 NOTE — Telephone Encounter (Signed)
Patient called for refill:  °HYDROcodone-acetaminophen (NORCO/VICODIN) 5-325 MG tablet 28 tablet   °- [facility] ° °

## 2019-08-03 ENCOUNTER — Telehealth (HOSPITAL_COMMUNITY): Payer: Self-pay | Admitting: Physical Therapy

## 2019-08-03 ENCOUNTER — Ambulatory Visit (HOSPITAL_COMMUNITY): Payer: Self-pay | Admitting: Physical Therapy

## 2019-08-03 ENCOUNTER — Encounter (HOSPITAL_COMMUNITY): Payer: Medicare Other | Admitting: Physical Therapy

## 2019-08-03 NOTE — Telephone Encounter (Signed)
pt transportation is sick so she does not have a way to get here

## 2019-08-05 ENCOUNTER — Encounter (HOSPITAL_COMMUNITY): Payer: Medicare Other

## 2019-08-09 ENCOUNTER — Encounter (HOSPITAL_COMMUNITY): Payer: Medicare Other | Admitting: Physical Therapy

## 2019-08-09 ENCOUNTER — Telehealth: Payer: Self-pay | Admitting: Orthopaedic Surgery

## 2019-08-09 MED ORDER — HYDROCODONE-ACETAMINOPHEN 5-325 MG PO TABS: ORAL_TABLET | ORAL | 0 refills | Status: DC

## 2019-08-09 NOTE — Telephone Encounter (Signed)
Patient called for refill:  °HYDROcodone-acetaminophen (NORCO/VICODIN) 5-325 MG tablet 28 tablet   °- [facility] ° °

## 2019-08-10 ENCOUNTER — Encounter (HOSPITAL_COMMUNITY): Payer: Medicare Other | Admitting: Physical Therapy

## 2019-08-12 ENCOUNTER — Encounter (HOSPITAL_COMMUNITY): Payer: Medicare Other

## 2019-08-16 ENCOUNTER — Encounter (HOSPITAL_COMMUNITY): Payer: Medicare Other | Admitting: Physical Therapy

## 2019-08-16 ENCOUNTER — Encounter: Payer: Self-pay | Admitting: Orthopaedic Surgery

## 2019-08-16 ENCOUNTER — Ambulatory Visit: Payer: Medicare Other

## 2019-08-16 ENCOUNTER — Ambulatory Visit (INDEPENDENT_AMBULATORY_CARE_PROVIDER_SITE_OTHER): Payer: Medicare Other | Admitting: Orthopaedic Surgery

## 2019-08-16 ENCOUNTER — Other Ambulatory Visit: Payer: Self-pay

## 2019-08-16 VITALS — BP 95/63 | HR 80 | Ht 74.0 in | Wt 176.0 lb

## 2019-08-16 DIAGNOSIS — M25562 Pain in left knee: Secondary | ICD-10-CM

## 2019-08-16 DIAGNOSIS — M79605 Pain in left leg: Secondary | ICD-10-CM | POA: Diagnosis not present

## 2019-08-16 DIAGNOSIS — M545 Low back pain, unspecified: Secondary | ICD-10-CM

## 2019-08-16 DIAGNOSIS — G8929 Other chronic pain: Secondary | ICD-10-CM

## 2019-08-16 MED ORDER — HYDROCODONE-ACETAMINOPHEN 5-325 MG PO TABS: ORAL_TABLET | ORAL | 0 refills | Status: DC

## 2019-08-16 NOTE — Progress Notes (Signed)
Patient Courtney Patterson, female DOB:09/17/1980, 39 y.o. JSE:[id number]  Chief Complaint  Patient presents with  . Back Pain    Lumbar pain with radiation down left leg.    HPI  Courtney Patterson is a 39 y.o. female who has continued lower back pain.  She has pain on the left side radiating down the left leg.  She has been to PT. She is no better.  I will get MRI of the lumbar spine.  I will refill her pain medicine.  She is to continue PT.  I have read the PT notes.  She has pain also of the left knee medially.  She is post surgery on the right knee several years ago for meniscus tear.  She says the left knee feels like the right knee did then. She has swelling and popping but no giving way yet.   Body mass index is 22.6 kg/m.  ROS  Review of Systems  Constitutional: Positive for activity change.  Musculoskeletal: Positive for arthralgias, back pain, gait problem, joint swelling and neck pain.  Psychiatric/Behavioral: The patient is nervous/anxious.   All other systems reviewed and are negative.   All other systems reviewed and are negative.  The following is a summary of the past history medically, past history surgically, known current medicines, social history and family history.  This information is gathered electronically by the computer from prior information and documentation.  I review this each visit and have found including this information at this point in the chart is beneficial and informative.    Past Medical History:  Diagnosis Date  . Anxiety   . Arthritis   . Depression   . Knee pain, chronic     Past Surgical History:  Procedure Laterality Date  . ABDOMINAL HYSTERECTOMY    . DENTAL RESTORATION/EXTRACTION WITH X-RAY    . KNEE ARTHROSCOPY Right 01/25/2015   Procedure: ARTHROSCOPY KNEE;  Surgeon: Darreld Mclean, MD;  Location: [facility];  Service: Orthopedics;  Laterality: Right;  . KNEE ARTHROSCOPY WITH MEDIAL MENISECTOMY Right 01/25/2015   Procedure: KNEE  ARTHROSCOPY WITH PARTIAL MEDIAL MENISECTOMY;  Surgeon: Darreld Mclean, MD;  Location: [facility];  Service: Orthopedics;  Laterality: Right;  . KNEE ARTHROSCOPY WITH MEDIAL MENISECTOMY Right 12/31/2017   Procedure: KNEE ARTHROSCOPY WITH MEDIAL MENISECTOMY;  Surgeon: Vickki Hearing, MD;  Location: [facility];  Service: Orthopedics;  Laterality: Right;  . TUBAL LIGATION      Family History  Problem Relation Age of Onset  . Cancer Mother   . Cancer Father     Social History Social History   Tobacco Use  . Smoking status: Current Every Day Smoker    Packs/day: 0.50    Years: 18.00    Pack years: 9.00  . Smokeless tobacco: Never Used  Substance Use Topics  . Alcohol use: No    Alcohol/week: 0.0 standard drinks    Comment: remote marjuana  . Drug use: Yes    Types: Marijuana    Comment: last used 03/31/2019    No Known Allergies  Current Outpatient Medications  Medication Sig Dispense Refill  . clonazePAM (KLONOPIN) 0.5 MG tablet Take 0.5 mg by mouth 2 (two) times daily.    Marland Kitchen HYDROcodone-acetaminophen (NORCO/VICODIN) 5-325 MG tablet One tablet every six hours for pain.  Limit 7 days. 28 tablet 0  . naproxen (NAPROSYN) 500 MG tablet Take 1 tablet (500 mg total) by mouth 2 (two) times daily. 30 tablet 0  . sertraline (ZOLOFT) 50 MG tablet Take  50 mg by mouth daily.  2  . traZODone (DESYREL) 100 MG tablet Take 100 mg by mouth at bedtime.     No current facility-administered medications for this visit.     Physical Exam  Blood pressure 95/63, pulse 80, height 6\' 2"  (1.88 m), weight 176 lb (79.8 kg), last menstrual period 05/23/2015.  Constitutional: overall normal hygiene, normal nutrition, well developed, normal grooming, normal body habitus. Assistive device:none  Musculoskeletal: gait and station Limp left, muscle tone and strength are normal, no tremors or atrophy is present.  .  Neurological: coordination overall normal.  Deep tendon reflex/nerve stretch intact.  Sensation  normal.  Cranial nerves II-XII intact.   Skin:   Normal overall no scars, lesions, ulcers or rashes. No psoriasis.  Psychiatric: Alert and oriented x 3.  Recent memory intact, remote memory unclear.  Normal mood and affect. Well groomed.  Good eye contact.  Cardiovascular: overall no swelling, no varicosities, no edema bilaterally, normal temperatures of the legs and arms, no clubbing, cyanosis and good capillary refill.  Lymphatic: palpation is normal.  Left knee has slight effusion, medial pain, ROM 0 to 110, limp left, NV intact, weakly positive medial McMurray.  Spine/Pelvis examination:  Inspection:  Overall, sacoiliac joint benign and hips nontender; without crepitus or defects.   Thoracic spine inspection: Alignment normal without kyphosis present   Lumbar spine inspection:  Alignment  with normal lumbar lordosis, without scoliosis apparent.   Thoracic spine palpation:  without tenderness of spinal processes   Lumbar spine palpation: without tenderness of lumbar area; without tightness of lumbar muscles    Range of Motion:   Lumbar flexion, forward flexion is normal without pain or tenderness    Lumbar extension is full without pain or tenderness   Left lateral bend is normal without pain or tenderness   Right lateral bend is normal without pain or tenderness   Straight leg raising is normal  Strength & tone: normal   Stability overall normal stability  All other systems reviewed and are negative   The patient has been educated about the nature of the problem(s) and counseled on treatment options.  The patient appeared to understand what I have discussed and is in agreement with it.  Encounter Diagnoses  Name Primary?  . Chronic pain of left knee Yes  . Lumbar pain with radiation down left leg    X-rays were done of the left knee, reported separately.  PLAN Call if any problems.  Precautions discussed.  Continue current medications.   Return to clinic 2  weeks   I have reviewed the [employer] web site prior to prescribing narcotic medicine for this patient.   Get MRI of the lumbar spine.  Electronically Signed Sanjuana Kava, MD 6/8/202111:23 AM

## 2019-08-17 ENCOUNTER — Ambulatory Visit (HOSPITAL_COMMUNITY): Payer: Medicare Other | Admitting: Physical Therapy

## 2019-08-17 ENCOUNTER — Encounter (HOSPITAL_COMMUNITY): Payer: Medicare Other | Admitting: Physical Therapy

## 2019-08-18 ENCOUNTER — Telehealth (HOSPITAL_COMMUNITY): Payer: Self-pay | Admitting: Physical Therapy

## 2019-08-18 ENCOUNTER — Ambulatory Visit (HOSPITAL_COMMUNITY): Payer: Medicare Other | Attending: Orthopaedic Surgery | Admitting: Physical Therapy

## 2019-08-18 NOTE — Telephone Encounter (Signed)
No Show. Called and left patient VM about missed appointment. Today is last scheduled apt, left message that pt will have to call and reschedule if she would like to come back in.   10:23 AM, 08/18/19 Tereasa Coop, DPT Physical Therapy with [facility]  [phone] office

## 2019-08-19 ENCOUNTER — Encounter (HOSPITAL_COMMUNITY): Payer: Medicare Other | Admitting: Physical Therapy

## 2019-08-23 ENCOUNTER — Telehealth: Payer: Self-pay | Admitting: Orthopaedic Surgery

## 2019-08-23 MED ORDER — HYDROCODONE-ACETAMINOPHEN 5-325 MG PO TABS: ORAL_TABLET | ORAL | 0 refills | Status: DC

## 2019-08-23 NOTE — Telephone Encounter (Signed)
Patient called for refill:  °HYDROcodone-acetaminophen (NORCO/VICODIN) 5-325 MG tablet 28 tablet   °- [facility] ° °

## 2019-08-30 ENCOUNTER — Telehealth: Payer: Self-pay | Admitting: Orthopaedic Surgery

## 2019-08-30 ENCOUNTER — Ambulatory Visit: Payer: Medicare Other | Admitting: Orthopaedic Surgery

## 2019-08-30 MED ORDER — HYDROCODONE-ACETAMINOPHEN 5-325 MG PO TABS: ORAL_TABLET | ORAL | 0 refills | Status: DC

## 2019-08-30 NOTE — Telephone Encounter (Signed)
   HYDROcodone-acetaminophen (NORCO/VICODIN) 5-325 MG tablet 28 tablet  -[facility]

## 2019-09-06 ENCOUNTER — Telehealth: Payer: Self-pay | Admitting: Orthopaedic Surgery

## 2019-09-06 MED ORDER — HYDROCODONE-ACETAMINOPHEN 5-325 MG PO TABS: ORAL_TABLET | ORAL | 0 refills | Status: DC

## 2019-09-06 NOTE — Telephone Encounter (Signed)
Patient requests refill on Hydrocodone/Acetaminophen 5-325  Mgs.  Qty  28  Sig: One tablet every six hours for pain. Limit 7 days.  Patient states she uses [facility]

## 2019-09-13 ENCOUNTER — Telehealth: Payer: Self-pay | Admitting: Orthopaedic Surgery

## 2019-09-13 MED ORDER — HYDROCODONE-ACETAMINOPHEN 5-325 MG PO TABS: ORAL_TABLET | ORAL | 0 refills | Status: DC

## 2019-09-13 NOTE — Telephone Encounter (Signed)
Call from patient for refill:  HYDROcodone-acetaminophen (NORCO/VICODIN) 5-325 MG tablet 24 tablet  -[facility]

## 2019-09-15 ENCOUNTER — Ambulatory Visit (HOSPITAL_COMMUNITY)
Admission: RE | Admit: 2019-09-15 | Discharge: 2019-09-15 | Disposition: A | Discharge status: Home / Self Care | Payer: Medicare Other | Source: Ambulatory Visit | Attending: Orthopaedic Surgery | Admitting: Orthopaedic Surgery

## 2019-09-15 ENCOUNTER — Other Ambulatory Visit: Payer: Self-pay

## 2019-09-15 DIAGNOSIS — M545 Low back pain, unspecified: Secondary | ICD-10-CM

## 2019-09-15 DIAGNOSIS — M79605 Pain in left leg: Secondary | ICD-10-CM | POA: Diagnosis present

## 2019-09-20 ENCOUNTER — Ambulatory Visit (INDEPENDENT_AMBULATORY_CARE_PROVIDER_SITE_OTHER): Payer: Medicare Other | Admitting: Orthopaedic Surgery

## 2019-09-20 ENCOUNTER — Other Ambulatory Visit: Payer: Self-pay

## 2019-09-20 ENCOUNTER — Encounter: Payer: Self-pay | Admitting: Orthopaedic Surgery

## 2019-09-20 VITALS — Ht 74.0 in | Wt 176.0 lb

## 2019-09-20 DIAGNOSIS — M545 Low back pain, unspecified: Secondary | ICD-10-CM

## 2019-09-20 DIAGNOSIS — M79605 Pain in left leg: Secondary | ICD-10-CM | POA: Diagnosis not present

## 2019-09-20 DIAGNOSIS — G8929 Other chronic pain: Secondary | ICD-10-CM

## 2019-09-20 DIAGNOSIS — M25562 Pain in left knee: Secondary | ICD-10-CM

## 2019-09-20 DIAGNOSIS — F17210 Nicotine dependence, cigarettes, uncomplicated: Secondary | ICD-10-CM | POA: Diagnosis not present

## 2019-09-20 MED ORDER — HYDROCODONE-ACETAMINOPHEN 5-325 MG PO TABS: ORAL_TABLET | ORAL | 0 refills | Status: DC

## 2019-09-20 NOTE — Progress Notes (Signed)
Patient Courtney Patterson, female DOB:1980/10/17, 39 y.o. OIB:[id number]  Chief Complaint  Patient presents with   Back Pain    LBP    HPI  Courtney Patterson is a 39 y.o. female who has lower back pain with radiation down the left leg. She has had more pain this last week.  She has no new trauma.  MRI showed: IMPRESSION: 1. L4-L5 left subarticular disc protrusion with annular fissure minimally displacing the descending left L5 nerve root, which could cause left L5 radiculopathy. 2. Otherwise mild degenerative disc disease without spinal canal or neural foraminal stenosis.  I have independently reviewed the MRI.  I will have her see a neurosurgeon for this.  She may be candidate for epidural.   Body mass index is 22.6 kg/m.  ROS  Review of Systems  All other systems reviewed and are negative.  The following is a summary of the past history medically, past history surgically, known current medicines, social history and family history.  This information is gathered electronically by the computer from prior information and documentation.  I review this each visit and have found including this information at this point in the chart is beneficial and informative.    Past Medical History:  Diagnosis Date   Anxiety    Arthritis    Depression    Knee pain, chronic     Past Surgical History:  Procedure Laterality Date   ABDOMINAL HYSTERECTOMY     DENTAL RESTORATION/EXTRACTION WITH X-RAY     KNEE ARTHROSCOPY Right 01/25/2015   Procedure: ARTHROSCOPY KNEE;  Surgeon: Darreld Mclean, MD;  Location: [facility];  Service: Orthopedics;  Laterality: Right;   KNEE ARTHROSCOPY WITH MEDIAL MENISECTOMY Right 01/25/2015   Procedure: KNEE ARTHROSCOPY WITH PARTIAL MEDIAL MENISECTOMY;  Surgeon: Darreld Mclean, MD;  Location: [facility];  Service: Orthopedics;  Laterality: Right;   KNEE ARTHROSCOPY WITH MEDIAL MENISECTOMY Right 12/31/2017   Procedure: KNEE ARTHROSCOPY WITH MEDIAL MENISECTOMY;   Surgeon: Vickki Hearing, MD;  Location: [facility];  Service: Orthopedics;  Laterality: Right;   TUBAL LIGATION      Family History  Problem Relation Age of Onset   Cancer Mother    Cancer Father     Social History Social History   Tobacco Use   Smoking status: Current Every Day Smoker    Packs/day: 0.50    Years: 18.00    Pack years: 9.00   Smokeless tobacco: Never Used  Building services engineer Use: Never used  Substance Use Topics   Alcohol use: No    Alcohol/week: 0.0 standard drinks    Comment: remote marjuana   Drug use: Yes    Types: Marijuana    Comment: last used 03/31/2019    No Known Allergies  Current Outpatient Medications  Medication Sig Dispense Refill   clonazePAM (KLONOPIN) 0.5 MG tablet Take 0.5 mg by mouth 2 (two) times daily.     HYDROcodone-acetaminophen (NORCO/VICODIN) 5-325 MG tablet One tablet every six hours for pain.  Limit 7 days. 24 tablet 0   naproxen (NAPROSYN) 500 MG tablet Take 1 tablet (500 mg total) by mouth 2 (two) times daily. 30 tablet 0   sertraline (ZOLOFT) 50 MG tablet Take 50 mg by mouth daily.  2   traZODone (DESYREL) 100 MG tablet Take 100 mg by mouth at bedtime.     No current facility-administered medications for this visit.     Physical Exam  Height 6\' 2"  (1.88 m), weight 176 lb (79.8 kg), last  menstrual period 05/23/2015.  Constitutional: overall normal hygiene, normal nutrition, well developed, normal grooming, normal body habitus. Assistive device:none  Musculoskeletal: gait and station Limp none, muscle tone and strength are normal, no tremors or atrophy is present.  .  Neurological: coordination overall normal.  Deep tendon reflex/nerve stretch intact.  Sensation normal.  Cranial nerves II-XII intact.   Skin:   Normal overall no scars, lesions, ulcers or rashes. No psoriasis.  Psychiatric: Alert and oriented x 3.  Recent memory intact, remote memory unclear.  Normal mood and affect. Well groomed.   Good eye contact.  Cardiovascular: overall no swelling, no varicosities, no edema bilaterally, normal temperatures of the legs and arms, no clubbing, cyanosis and good capillary refill.  Lymphatic: palpation is normal.  Spine/Pelvis examination:  Inspection:  Overall, sacoiliac joint benign and hips nontender; without crepitus or defects.   Thoracic spine inspection: Alignment normal without kyphosis present   Lumbar spine inspection:  Alignment  with normal lumbar lordosis, without scoliosis apparent.   Thoracic spine palpation:  without tenderness of spinal processes   Lumbar spine palpation: without tenderness of lumbar area; without tightness of lumbar muscles    Range of Motion:   Lumbar flexion, forward flexion is normal without pain or tenderness    Lumbar extension is full without pain or tenderness   Left lateral bend is normal without pain or tenderness   Right lateral bend is normal without pain or tenderness   Straight leg raising is normal  Strength & tone: normal   Stability overall normal stability  All other systems reviewed and are negative   The patient has been educated about the nature of the problem(s) and counseled on treatment options.  The patient appeared to understand what I have discussed and is in agreement with it.  Encounter Diagnoses  Name Primary?   Lumbar pain with radiation down left leg Yes   Nicotine dependence, cigarettes, uncomplicated    Chronic pain of left knee     PLAN Call if any problems.  Precautions discussed.  Continue current medications.   Return to clinic to see Neurosurgeon   Electronically Signed Darreld Mclean, MD 7/13/202110:40 AM

## 2019-09-27 ENCOUNTER — Telehealth: Payer: Self-pay | Admitting: Orthopaedic Surgery

## 2019-09-27 MED ORDER — HYDROCODONE-ACETAMINOPHEN 5-325 MG PO TABS: ORAL_TABLET | ORAL | 0 refills | Status: DC

## 2019-09-27 NOTE — Telephone Encounter (Signed)
Patient called for refill: HYDROcodone-acetaminophen (NORCO/VICODIN) 5-325 MG tablet 24 tablet  -[facility]  

## 2019-10-04 ENCOUNTER — Telehealth: Payer: Self-pay | Admitting: Orthopaedic Surgery

## 2019-10-04 MED ORDER — HYDROCODONE-ACETAMINOPHEN 5-325 MG PO TABS: ORAL_TABLET | ORAL | 0 refills | Status: DC

## 2019-10-04 NOTE — Telephone Encounter (Signed)
Patient requests refill on Hydrocodone/Acetaminophen 5-325  Mgs.  Qty  28  Sig: One tablet every six hours for pain. Limit 7 days.  Patient states she uses [facility] 

## 2019-10-11 ENCOUNTER — Telehealth: Payer: Self-pay | Admitting: Orthopaedic Surgery

## 2019-10-11 MED ORDER — HYDROCODONE-ACETAMINOPHEN 5-325 MG PO TABS: ORAL_TABLET | ORAL | 0 refills | Status: DC

## 2019-10-11 NOTE — Telephone Encounter (Signed)
Patient requests refill on Hydrocodone/Acetaminophen 5-325 mgs.  Qty 24  Sig: One tablet every six hours for pain. Limit 7 days.   Patient states she uses [facility]

## 2019-10-18 ENCOUNTER — Telehealth: Payer: Self-pay | Admitting: Orthopaedic Surgery

## 2019-10-18 MED ORDER — HYDROCODONE-ACETAMINOPHEN 5-325 MG PO TABS: ORAL_TABLET | ORAL | 0 refills | Status: DC

## 2019-10-18 NOTE — Telephone Encounter (Signed)
Patient called for refill: HYDROcodone-acetaminophen (NORCO/VICODIN) 5-325 MG tablet 24 tablet  -[facility]

## 2019-10-25 ENCOUNTER — Telehealth: Payer: Self-pay | Admitting: Orthopaedic Surgery

## 2019-10-25 MED ORDER — HYDROCODONE-ACETAMINOPHEN 5-325 MG PO TABS: ORAL_TABLET | ORAL | 0 refills | Status: DC

## 2019-10-25 NOTE — Telephone Encounter (Signed)
Patient requests refill: HYDROcodone-acetaminophen (NORCO/VICODIN) 5-325 MG tablet 24 tablet  -[facility]

## 2019-10-25 NOTE — Telephone Encounter (Signed)
Done

## 2019-10-28 ENCOUNTER — Other Ambulatory Visit: Payer: Self-pay | Admitting: Physician Assistant

## 2019-10-28 DIAGNOSIS — G8929 Other chronic pain: Secondary | ICD-10-CM

## 2019-11-01 ENCOUNTER — Other Ambulatory Visit: Payer: Self-pay | Admitting: Orthopedic Surgery

## 2019-11-01 MED ORDER — HYDROCODONE-ACETAMINOPHEN 5-325 MG PO TABS: ORAL_TABLET | ORAL | 0 refills | Status: DC

## 2019-11-01 NOTE — Telephone Encounter (Signed)
Patient called the office for a refill of her Hydrocodone. It was last filled for 20 tablets on 10/25/2019 for a 1 week supply.   One tablet every six hours for pain. Limit 7 days.  Patient uses [facility]. Please advise.

## 2019-11-08 ENCOUNTER — Telehealth: Payer: Self-pay | Admitting: Orthopaedic Surgery

## 2019-11-08 MED ORDER — HYDROCODONE-ACETAMINOPHEN 5-325 MG PO TABS: ORAL_TABLET | ORAL | 0 refills | Status: DC

## 2019-11-08 NOTE — Telephone Encounter (Signed)
Patient requests refill: HYDROcodone-acetaminophen (NORCO/VICODIN) 5-325 MG tablet 20 tablet  -[facility]

## 2019-11-15 ENCOUNTER — Ambulatory Visit (INDEPENDENT_AMBULATORY_CARE_PROVIDER_SITE_OTHER): Payer: Medicare Other | Admitting: Orthopaedic Surgery

## 2019-11-15 ENCOUNTER — Encounter: Payer: Self-pay | Admitting: Orthopaedic Surgery

## 2019-11-15 ENCOUNTER — Other Ambulatory Visit: Payer: Self-pay

## 2019-11-15 VITALS — BP 104/71 | HR 69 | Ht 74.0 in | Wt 172.0 lb

## 2019-11-15 DIAGNOSIS — M25562 Pain in left knee: Secondary | ICD-10-CM

## 2019-11-15 DIAGNOSIS — G8929 Other chronic pain: Secondary | ICD-10-CM | POA: Diagnosis not present

## 2019-11-15 MED ORDER — HYDROCODONE-ACETAMINOPHEN 5-325 MG PO TABS: ORAL_TABLET | ORAL | 0 refills | Status: DC

## 2019-11-15 NOTE — Progress Notes (Signed)
Patient ID:POEUM [name], female DOB:10-24-1980, 39 y.o. PNT:[id number]  Chief Complaint  Patient presents with  . Knee Pain    Bilat knee pain    HPI  Courtney Patterson is a 39 y.o. female who has giving way of the left knee and swelling.  It is getting worse.  I am concerned about meniscus tear.  I will get MRI.   Body mass index is 22.08 kg/m.  ROS  Review of Systems  Constitutional: Positive for activity change.  Musculoskeletal: Positive for arthralgias, back pain, gait problem, joint swelling and neck pain.  Psychiatric/Behavioral: The patient is nervous/anxious.   All other systems reviewed and are negative.   All other systems reviewed and are negative.  The following is a summary of the past history medically, past history surgically, known current medicines, social history and family history.  This information is gathered electronically by the computer from prior information and documentation.  I review this each visit and have found including this information at this point in the chart is beneficial and informative.    Past Medical History:  Diagnosis Date  . Anxiety   . Arthritis   . Depression   . Knee pain, chronic     Past Surgical History:  Procedure Laterality Date  . ABDOMINAL HYSTERECTOMY    . DENTAL RESTORATION/EXTRACTION WITH X-RAY    . KNEE ARTHROSCOPY Right 01/25/2015   Procedure: ARTHROSCOPY KNEE;  Surgeon: Darreld Mclean, MD;  Location: [facility];  Service: Orthopedics;  Laterality: Right;  . KNEE ARTHROSCOPY WITH MEDIAL MENISECTOMY Right 01/25/2015   Procedure: KNEE ARTHROSCOPY WITH PARTIAL MEDIAL MENISECTOMY;  Surgeon: Darreld Mclean, MD;  Location: [facility];  Service: Orthopedics;  Laterality: Right;  . KNEE ARTHROSCOPY WITH MEDIAL MENISECTOMY Right 12/31/2017   Procedure: KNEE ARTHROSCOPY WITH MEDIAL MENISECTOMY;  Surgeon: Vickki Hearing, MD;  Location: [facility];  Service: Orthopedics;  Laterality: Right;  . TUBAL LIGATION      Family History   Problem Relation Age of Onset  . Cancer Mother   . Cancer Father     Social History Social History   Tobacco Use  . Smoking status: Current Every Day Smoker    Packs/day: 0.50    Years: 18.00    Pack years: 9.00  . Smokeless tobacco: Never Used  Vaping Use  . Vaping Use: Never used  Substance Use Topics  . Alcohol use: No    Alcohol/week: 0.0 standard drinks    Comment: remote marjuana  . Drug use: Yes    Types: Marijuana    Comment: last used 03/31/2019    No Known Allergies  Current Outpatient Medications  Medication Sig Dispense Refill  . clonazePAM (KLONOPIN) 0.5 MG tablet Take 0.5 mg by mouth 2 (two) times daily.    Marland Kitchen HYDROcodone-acetaminophen (NORCO/VICODIN) 5-325 MG tablet One tablet every six hours for pain.  Limit 7 days. 20 tablet 0  . naproxen (NAPROSYN) 500 MG tablet Take 1 tablet (500 mg total) by mouth 2 (two) times daily. 30 tablet 0  . sertraline (ZOLOFT) 50 MG tablet Take 50 mg by mouth daily.  2  . traZODone (DESYREL) 100 MG tablet Take 100 mg by mouth at bedtime.     No current facility-administered medications for this visit.     Physical Exam  Blood pressure 104/71, pulse 69, height 6\' 2"  (1.88 m), weight 172 lb (78 kg), last menstrual period 05/23/2015.  Constitutional: overall normal hygiene, normal nutrition, well developed, normal grooming, normal body habitus. Assistive device:none  Musculoskeletal: gait and station Limp left, muscle tone and strength are normal, no tremors or atrophy is present.  .  Neurological: coordination overall normal.  Deep tendon reflex/nerve stretch intact.  Sensation normal.  Cranial nerves II-XII intact.   Skin:   Normal overall no scars, lesions, ulcers or rashes. No psoriasis.  Psychiatric: Alert and oriented x 3.  Recent memory intact, remote memory unclear.  Normal mood and affect. Well groomed.  Good eye contact.  Cardiovascular: overall no swelling, no varicosities, no edema bilaterally, normal  temperatures of the legs and arms, no clubbing, cyanosis and good capillary refill.  Lymphatic: palpation is normal.  Left knee with effusion, crepitus, ROM 0 to 110, positive medial McMurray.  All other systems reviewed and are negative   The patient has been educated about the nature of the problem(s) and counseled on treatment options.  The patient appeared to understand what I have discussed and is in agreement with it.  Encounter Diagnosis  Name Primary?  . Chronic pain of left knee Yes    PLAN Call if any problems.  Precautions discussed.  Continue current medications.   Return to clinic 2 weeks   Get MRI of the left knee.  Electronically Signed Darreld Mclean, MD 9/7/20219:49 AM

## 2019-11-15 NOTE — Patient Instructions (Signed)
MRI scan has been ordered for you, please call to schedule at [facility] [phone]

## 2019-11-22 ENCOUNTER — Telehealth: Payer: Self-pay | Admitting: Orthopaedic Surgery

## 2019-11-22 MED ORDER — HYDROCODONE-ACETAMINOPHEN 5-325 MG PO TABS: ORAL_TABLET | ORAL | 0 refills | Status: DC

## 2019-11-22 NOTE — Telephone Encounter (Signed)
Patient requests refill on Hydrocodone/Acetaminophen 5-325  Mgs.  Qty  20  Sig: One tablet every six hours for pain. Limit 7 days.  Patient uses [facility] in [place]

## 2019-11-29 ENCOUNTER — Ambulatory Visit: Payer: Medicare Other | Admitting: Orthopaedic Surgery

## 2019-11-29 ENCOUNTER — Encounter: Payer: Self-pay | Admitting: Orthopedic Surgery

## 2019-11-29 ENCOUNTER — Telehealth: Payer: Self-pay | Admitting: Orthopaedic Surgery

## 2019-11-29 MED ORDER — HYDROCODONE-ACETAMINOPHEN 5-325 MG PO TABS: ORAL_TABLET | ORAL | 0 refills | Status: DC

## 2019-11-29 NOTE — Telephone Encounter (Signed)
HYDROcodone-acetaminophen (NORCO/VICODIN) 5-325 MG tablet 20 tablet  -[facility]

## 2019-12-06 ENCOUNTER — Telehealth: Payer: Self-pay | Admitting: Orthopaedic Surgery

## 2019-12-06 MED ORDER — HYDROCODONE-ACETAMINOPHEN 5-325 MG PO TABS: ORAL_TABLET | ORAL | 0 refills | Status: DC

## 2019-12-06 NOTE — Telephone Encounter (Signed)
Patient requests refill on Hydrocodone/Acetaminophen 5-325  Mgs.  Qty  20 °  °Sig: One tablet every six hours for pain.  Limit 7 days. °  °Patient uses [facility] in [place] °

## 2019-12-08 ENCOUNTER — Ambulatory Visit (HOSPITAL_COMMUNITY)
Admission: RE | Admit: 2019-12-08 | Discharge: 2019-12-08 | Disposition: A | Discharge status: Home / Self Care | Payer: Medicare Other | Source: Ambulatory Visit | Attending: Orthopaedic Surgery | Admitting: Orthopaedic Surgery

## 2019-12-08 ENCOUNTER — Other Ambulatory Visit: Payer: Self-pay

## 2019-12-08 DIAGNOSIS — M25562 Pain in left knee: Secondary | ICD-10-CM | POA: Diagnosis not present

## 2019-12-08 DIAGNOSIS — G8929 Other chronic pain: Secondary | ICD-10-CM | POA: Diagnosis present

## 2019-12-13 ENCOUNTER — Ambulatory Visit (INDEPENDENT_AMBULATORY_CARE_PROVIDER_SITE_OTHER): Payer: Medicare Other | Admitting: Orthopaedic Surgery

## 2019-12-13 ENCOUNTER — Encounter: Payer: Self-pay | Admitting: Orthopaedic Surgery

## 2019-12-13 ENCOUNTER — Encounter (HOSPITAL_COMMUNITY): Payer: Self-pay | Admitting: Physical Therapy

## 2019-12-13 ENCOUNTER — Other Ambulatory Visit: Payer: Self-pay

## 2019-12-13 VITALS — BP 117/79 | HR 75 | Ht 74.0 in | Wt 172.0 lb

## 2019-12-13 DIAGNOSIS — S83242D Other tear of medial meniscus, current injury, left knee, subsequent encounter: Secondary | ICD-10-CM

## 2019-12-13 MED ORDER — NAPROXEN 500 MG PO TABS: 500.0000 mg | ORAL_TABLET | Freq: Two times a day (BID) | ORAL | 0 refills | Status: DC

## 2019-12-13 MED ORDER — HYDROCODONE-ACETAMINOPHEN 5-325 MG PO TABS: ORAL_TABLET | ORAL | 0 refills | Status: DC

## 2019-12-13 NOTE — Patient Instructions (Signed)
Dr Romeo Apple for surgical consult left knee

## 2019-12-13 NOTE — Therapy (Addendum)
[place], [place], [address] Phone: [phone]   Fax:  [phone]  Patient Details  Name: Courtney Patterson MRN: [id number] Date of Birth: 1980/12/25 Referring Provider:  No ref. provider found  Encounter Date: 12/13/2019   PHYSICAL THERAPY DISCHARGE SUMMARY  Visits from Start of Care: 7  Current functional level related to goals / functional outcomes: Unknown as patient did not return to therapy   Remaining deficits: Unknown as patient did not return to therapy   Education / Equipment: HEP Plan: Patient agrees to discharge.  Patient goals were partially met. Patient is being discharged due to not returning since the last visit.  ?????     Clarene Critchley PT, DPT 9:58 AM, 12/13/19 [place], [place], [address] Phone: [phone]   Fax:  [phone]

## 2019-12-13 NOTE — Progress Notes (Signed)
Patient Courtney Patterson, female DOB:[birth year], 39 y.o. ATF:[id number]  Chief Complaint  Patient presents with  . Knee Pain    left   . Results    review MRI     HPI  Courtney Patterson is a 39 y.o. female who has pain of the left knee.  She had MRI which showed: IMPRESSION: 1. Complex tear of the posterior horn of the medial meniscus extending into the posterior body and towards the inferior articular surface. 2. Mild partial-thickness cartilage loss of the medial femorotibial compartment. 3. Partial tear of the medial gastrocnemius tendon origin.  I have explained the findings to her.  She will need arthroscopy. She would like to see Dr. Romeo Apple and I will arrange this.   Body mass index is 22.08 kg/m.  ROS  Review of Systems  Constitutional: Positive for activity change.  Musculoskeletal: Positive for arthralgias, back pain, gait problem, joint swelling and neck pain.  Psychiatric/Behavioral: The patient is nervous/anxious.   All other systems reviewed and are negative.   All other systems reviewed and are negative.  The following is a summary of the past history medically, past history surgically, known current medicines, social history and family history.  This information is gathered electronically by the computer from prior information and documentation.  I review this each visit and have found including this information at this point in the chart is beneficial and informative.    Past Medical History:  Diagnosis Date  . Anxiety   . Arthritis   . Depression   . Knee pain, chronic     Past Surgical History:  Procedure Laterality Date  . ABDOMINAL HYSTERECTOMY    . DENTAL RESTORATION/EXTRACTION WITH X-RAY    . KNEE ARTHROSCOPY Right 01/25/2015   Procedure: ARTHROSCOPY KNEE;  Surgeon: Darreld Mclean, MD;  Location: [facility];  Service: Orthopedics;  Laterality: Right;  . KNEE ARTHROSCOPY WITH MEDIAL MENISECTOMY Right 01/25/2015   Procedure: KNEE ARTHROSCOPY WITH  PARTIAL MEDIAL MENISECTOMY;  Surgeon: Darreld Mclean, MD;  Location: [facility];  Service: Orthopedics;  Laterality: Right;  . KNEE ARTHROSCOPY WITH MEDIAL MENISECTOMY Right 12/31/2017   Procedure: KNEE ARTHROSCOPY WITH MEDIAL MENISECTOMY;  Surgeon: Vickki Hearing, MD;  Location: [facility];  Service: Orthopedics;  Laterality: Right;  . TUBAL LIGATION      Family History  Problem Relation Age of Onset  . Cancer Mother   . Cancer Father     Social History Social History   Tobacco Use  . Smoking status: Current Every Day Smoker    Packs/day: 0.50    Years: 18.00    Pack years: 9.00  . Smokeless tobacco: Never Used  Vaping Use  . Vaping Use: Never used  Substance Use Topics  . Alcohol use: No    Alcohol/week: 0.0 standard drinks    Comment: remote marjuana  . Drug use: Yes    Types: Marijuana    Comment: last used 03/31/2019    No Known Allergies  Current Outpatient Medications  Medication Sig Dispense Refill  . clonazePAM (KLONOPIN) 0.5 MG tablet Take 0.5 mg by mouth 2 (two) times daily.    Marland Kitchen HYDROcodone-acetaminophen (NORCO/VICODIN) 5-325 MG tablet One tablet every six hours for pain.  Limit 7 days. 18 tablet 0  . naproxen (NAPROSYN) 500 MG tablet Take 1 tablet (500 mg total) by mouth 2 (two) times daily. 30 tablet 0  . sertraline (ZOLOFT) 50 MG tablet Take 50 mg by mouth daily.  2  . traZODone (DESYREL) 100 MG  tablet Take 100 mg by mouth at bedtime.     No current facility-administered medications for this visit.     Physical Exam  Blood pressure 117/79, pulse 75, height 6\' 2"  (1.88 m), weight 172 lb (78 kg), last menstrual period 05/23/2015.  Constitutional: overall normal hygiene, normal nutrition, well developed, normal grooming, normal body habitus. Assistive device:none  Musculoskeletal: gait and station Limp left, muscle tone and strength are normal, no tremors or atrophy is present.  .  Neurological: coordination overall normal.  Deep tendon reflex/nerve  stretch intact.  Sensation normal.  Cranial nerves II-XII intact.   Skin:   Normal overall no scars, lesions, ulcers or rashes. No psoriasis.  Psychiatric: Alert and oriented x 3.  Recent memory intact, remote memory unclear.  Normal mood and affect. Well groomed.  Good eye contact.  Cardiovascular: overall no swelling, no varicosities, no edema bilaterally, normal temperatures of the legs and arms, no clubbing, cyanosis and good capillary refill.  Lymphatic: palpation is normal.  Left knee has pain, crepitus, slight effusion, ROM 0 to 110, positive medial McMurray, NV intact.  All other systems reviewed and are negative   The patient has been educated about the nature of the problem(s) and counseled on treatment options.  The patient appeared to understand what I have discussed and is in agreement with it.  Encounter Diagnosis  Name Primary?  . Other tear of medial meniscus, current injury, left knee, subsequent encounter Yes    PLAN Call if any problems.  Precautions discussed.  Continue current medications.   Return to clinic to see Dr. 05/25/2015   Electronically Signed Romeo Apple, MD 10/5/202110:46 AM

## 2019-12-20 ENCOUNTER — Telehealth: Payer: Self-pay | Admitting: Orthopaedic Surgery

## 2019-12-20 MED ORDER — HYDROCODONE-ACETAMINOPHEN 5-325 MG PO TABS: ORAL_TABLET | ORAL | 0 refills | Status: DC

## 2019-12-20 NOTE — Telephone Encounter (Signed)
Patient called to request refill:  HYDROcodone-acetaminophen (NORCO/VICODIN) 5-325 MG tablet 18 tablet  -[facility]  -patient is aware of upcoming consult to discuss surgery/appointment is scheduled 01/02/20 with Dr Romeo Apple.

## 2019-12-27 ENCOUNTER — Telehealth: Payer: Self-pay | Admitting: Orthopaedic Surgery

## 2019-12-27 MED ORDER — HYDROCODONE-ACETAMINOPHEN 5-325 MG PO TABS: ORAL_TABLET | ORAL | 0 refills | Status: DC

## 2019-12-27 NOTE — Telephone Encounter (Signed)
Patient requests refill on Hydrocodone/Acetaminophen 5-325  Mgs.  Qty  20 °  °Sig: One tablet every six hours for pain.  Limit 7 days. °  °Patient uses [facility] in [place] °

## 2020-01-02 ENCOUNTER — Ambulatory Visit: Payer: Medicare Other | Admitting: Orthopedic Surgery

## 2020-01-03 ENCOUNTER — Telehealth: Payer: Self-pay | Admitting: Orthopaedic Surgery

## 2020-01-03 MED ORDER — HYDROCODONE-ACETAMINOPHEN 5-325 MG PO TABS: ORAL_TABLET | ORAL | 0 refills | Status: DC

## 2020-01-03 NOTE — Telephone Encounter (Signed)
Patient requests refill on Hydrocodone/Acetaminophen 5-325 Mgs. Qty 20  Sig:One tablet every six hours for pain. Limit 7 days.  Patient uses [facility] in [place]

## 2020-01-09 ENCOUNTER — Encounter: Payer: Self-pay | Admitting: Cardiology

## 2020-01-09 NOTE — Progress Notes (Deleted)
Cardiology Office Note  Date: 01/09/2020   ID: Courtney Patterson, DOB September 08, 1980, MRN [id number]  PCP:  Smith Robert, MD  Cardiologist:  Nona Dell, MD Electrophysiologist:  None   No chief complaint on file.   History of Present Illness: Courtney Patterson is a 39 y.o. female referred for cardiology consultation by Dr. Bryna Colander for the evaluation of shortness of breath and chest discomfort.  Past Medical History:  Diagnosis Date  . Chronic pain   . History of asthma   . History of blood transfusion     Past Surgical History:  Procedure Laterality Date  . ABDOMINAL HYSTERECTOMY    . DENTAL RESTORATION/EXTRACTION WITH X-RAY    . KNEE ARTHROSCOPY Right 01/25/2015   Procedure: ARTHROSCOPY KNEE;  Surgeon: Darreld Mclean, MD;  Location: [facility];  Service: Orthopedics;  Laterality: Right;  . KNEE ARTHROSCOPY WITH MEDIAL MENISECTOMY Right 01/25/2015   Procedure: KNEE ARTHROSCOPY WITH PARTIAL MEDIAL MENISECTOMY;  Surgeon: Darreld Mclean, MD;  Location: [facility];  Service: Orthopedics;  Laterality: Right;  . KNEE ARTHROSCOPY WITH MEDIAL MENISECTOMY Right 12/31/2017   Procedure: KNEE ARTHROSCOPY WITH MEDIAL MENISECTOMY;  Surgeon: Vickki Hearing, MD;  Location: [facility];  Service: Orthopedics;  Laterality: Right;  . TUBAL LIGATION      Current Outpatient Medications  Medication Sig Dispense Refill  . clonazePAM (KLONOPIN) 0.5 MG tablet Take 0.5 mg by mouth 2 (two) times daily.    Marland Kitchen HYDROcodone-acetaminophen (NORCO/VICODIN) 5-325 MG tablet One tablet every six hours for pain.  Limit 7 days. 18 tablet 0  . naproxen (NAPROSYN) 500 MG tablet Take 1 tablet (500 mg total) by mouth 2 (two) times daily. 30 tablet 0  . sertraline (ZOLOFT) 50 MG tablet Take 50 mg by mouth daily.  2  . traZODone (DESYREL) 100 MG tablet Take 100 mg by mouth at bedtime.     No current facility-administered medications for this visit.   Allergies:  Patient has no known allergies.   Social History: The patient   reports that she has been smoking cigarettes. She has a 9.00 pack-year smoking history. She has never used smokeless tobacco. She reports current drug use. Drug: Marijuana. She reports that she does not drink alcohol.   Family History: The patient's family history includes Breast cancer in her mother; COPD in her father and mother; Melanoma in her father.   ROS:  Please see the history of present illness. Otherwise, complete review of systems is positive for {NONE DEFAULTED:18576::"none"}.  All other systems are reviewed and negative.   Physical Exam: VS:  LMP 05/23/2015 , BMI There is no height or weight on file to calculate BMI.  Wt Readings from Last 3 Encounters:  12/13/19 172 lb (78 kg)  11/15/19 172 lb (78 kg)  09/20/19 176 lb (79.8 kg)    General: Patient appears comfortable at rest. HEENT: Conjunctiva and lids normal, oropharynx clear with moist mucosa. Neck: Supple, no elevated JVP or carotid bruits, no thyromegaly. Lungs: Clear to auscultation, nonlabored breathing at rest. Cardiac: Regular rate and rhythm, no S3 or significant systolic murmur, no pericardial rub. Abdomen: Soft, nontender, no hepatomegaly, bowel sounds present, no guarding or rebound. Extremities: No pitting edema, distal pulses 2+. Skin: Warm and dry. Musculoskeletal: No kyphosis. Neuropsychiatric: Alert and oriented x3, affect grossly appropriate.  ECG:  No prior tracing available for review today.  Recent Labwork:  No recent lab work available for review today.  Other Studies Reviewed Today:  No prior cardiac testing for review  today.  Assessment and Plan:    Medication Adjustments/Labs and Tests Ordered: Current medicines are reviewed at length with the patient today.  Concerns regarding medicines are outlined above.   Tests Ordered: No orders of the defined types were placed in this encounter.   Medication Changes: No orders of the defined types were placed in this  encounter.   Disposition:  Follow up {follow up:15908}  Signed, Jonelle Sidle, MD, [facility] 01/09/2020 1:34 PM    [facility] [address] [place], [place], [place] [address] Phone: [phone]; Fax: [phone]

## 2020-01-10 ENCOUNTER — Ambulatory Visit: Payer: Medicare Other | Admitting: Cardiology

## 2020-01-10 ENCOUNTER — Telehealth: Payer: Self-pay | Admitting: Orthopaedic Surgery

## 2020-01-10 DIAGNOSIS — R072 Precordial pain: Secondary | ICD-10-CM

## 2020-01-10 MED ORDER — HYDROCODONE-ACETAMINOPHEN 5-325 MG PO TABS: ORAL_TABLET | ORAL | 0 refills | Status: DC

## 2020-01-10 NOTE — Telephone Encounter (Signed)
Patient requests refill on Hydrocodone/Acetaminophen 5-325  Mgs.  Qty  20 °  °Sig: One tablet every six hours for pain.  Limit 7 days. °  °Patient uses [facility] in [place] °

## 2020-01-11 ENCOUNTER — Encounter: Payer: Self-pay | Admitting: Orthopedic Surgery

## 2020-01-11 ENCOUNTER — Other Ambulatory Visit: Payer: Self-pay

## 2020-01-11 ENCOUNTER — Ambulatory Visit (INDEPENDENT_AMBULATORY_CARE_PROVIDER_SITE_OTHER): Payer: Medicare Other | Admitting: Orthopedic Surgery

## 2020-01-11 VITALS — BP 100/71 | HR 65 | Ht 74.0 in | Wt 168.0 lb

## 2020-01-11 DIAGNOSIS — Z9889 Other specified postprocedural states: Secondary | ICD-10-CM

## 2020-01-11 DIAGNOSIS — M545 Low back pain, unspecified: Secondary | ICD-10-CM

## 2020-01-11 DIAGNOSIS — M79605 Pain in left leg: Secondary | ICD-10-CM

## 2020-01-11 DIAGNOSIS — S83242D Other tear of medial meniscus, current injury, left knee, subsequent encounter: Secondary | ICD-10-CM

## 2020-01-11 DIAGNOSIS — G8929 Other chronic pain: Secondary | ICD-10-CM | POA: Diagnosis not present

## 2020-01-11 MED ORDER — HYDROCODONE-ACETAMINOPHEN 10-325 MG PO TABS: 1.0000 | ORAL_TABLET | ORAL | 0 refills | Status: DC | PRN

## 2020-01-11 NOTE — Patient Instructions (Signed)
Meniscus Injury, Arthroscopy   Arthroscopy is a surgical procedure that involves the use of a small scope that has a camera and surgical instruments on the end (arthroscope). An arthroscope can be used to repair your meniscus injury.  LET YOUR HEALTH CARE PROVIDER KNOW ABOUT:  Any allergies you have.  All medicines you are taking, including vitamins, herbs, eyedrops, creams, and over-the-counter medicines.  Any recent colds or infections you have had or currently have.  Previous problems you or members of your family have had with the use of anesthetics.  Any blood disorders or blood clotting problems you have.  Previous surgeries you have had.  Medical conditions you have. RISKS AND COMPLICATIONS Generally, this is a safe procedure. However, as with any procedure, problems can occur. Possible problems include:  Damage to nerves or blood vessels.  Excess bleeding.  Blood clots.  Infection. BEFORE THE PROCEDURE  Do not eat or drink for 6-8 hours before the procedure.  Take medicines as directed by your surgeon. Ask your surgeon about changing or stopping your regular medicines.  You may have lab tests the morning of surgery. PROCEDURE  You will be given one of the following:   A medicine that numbs the area (local anesthesia).  A medicine that makes you go to sleep (general anesthesia).  A medicine injected into your spine that numbs your body below the waist (spinal anesthesia). Most often, several small cuts (incisions) are made in the knee. The arthroscope and instruments go into the incisions to repair the damage. The torn portion of the meniscus is removed.   AFTER THE PROCEDURE  You will be taken to the recovery area where your progress will be monitored. When you are awake, stable, and taking fluids without complications, you will be allowed to go home. This is usually the same day. A torn or stretched ligament (ligament sprain) may take 6-8 weeks to heal.   It  takes about the 4-6 WEEKS if your surgeon removed a torn meniscus.  A repaired meniscus may require 6-12 weeks of recovery time.  A torn ligament needing reconstructive surgery may take 6-12 months to heal fully.   This information is not intended to replace advice given to you by your health care provider. Make sure you discuss any questions you have with your health care provider. You have decided to proceed with operative arthroscopy of the knee. You have decided not to continue with nonoperative measures such as but not limited to oral medication, weight loss, activity modification, physical therapy, bracing, or injection.  We will perform operative arthroscopy of the knee. Some of the risks associated with arthroscopic surgery of the knee include but are not limited to Bleeding Infection Swelling Stiffness Blood clot Pain Need for knee replacement surgery    In compliance with recent [place] law in federal regulation regarding opioid use and abuse and addiction, we will taper (stop) opioid medication after 2 weeks.  If you're not comfortable with these risks and would like to continue with nonoperative treatment please let Dr. Ramisa Duman know prior to your surgery. 

## 2020-01-11 NOTE — Progress Notes (Signed)
Chief Complaint  Patient presents with  . Knee Pain    Lt knee    Encounter Diagnoses  Name Primary?  . Other tear of medial meniscus, current injury, left knee, subsequent encounter Yes  . Chronic pain of left knee   . Lumbar pain with radiation down left leg   . S/P arthroscopy of left knee      39 year old female who I did a right knee arthroscopy on back in 2019 presents with left knee medial pain mechanical symptoms failed conservative treatment  She also has a disc protrusion on the left side affecting the L5 root  She also has some cartilage damage on the articular surface of the medial femoral condyle however she wishes to proceed with surgery  We discussed her postop course which will include 4 to 6 weeks of postop rehab and then she will follow up with doctors who are treating her back and will continue with her pain management.  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

## 2020-01-12 NOTE — Patient Instructions (Signed)
Courtney Patterson  01/12/2020     @PREFPERIOPPHARMACY @   Your procedure is scheduled on  01/17/2020.  Report to 13/11/2019 at  0715  A.M.  Call this number if you have problems the morning of surgery:  [phone]   Remember:  Do not eat or drink after midnight.                         Take these medicines the morning of surgery with A SIP OF WATER  Clonazepam, hydrocodone(if needed), zoloft.    Do not wear jewelry, make-up or nail polish.  Do not wear lotions, powders, or perfumes. Please wear deodorant and brush your teeth.  Do not shave 48 hours prior to surgery.  Men may shave face and neck.  Do not bring valuables to the hospital.  [facility] is not responsible for any belongings or valuables.  Contacts, dentures or bridgework may not be worn into surgery.  Leave your suitcase in the car.  After surgery it may be brought to your room.  For patients admitted to the hospital, discharge time will be determined by your treatment team.  Patients discharged the day of surgery will not be allowed to drive home.   Name and phone number of your driver:   family Special instructions:   DO NOT smoke the morning of your procedure.  Please read over the following fact sheets that you were given. Anesthesia Post-op Instructions and Care and Recovery After Surgery       Arthroscopic Knee Ligament Repair, Care After This sheet gives you information about how to care for yourself after your procedure. Your health care provider may also give you more specific instructions. If you have problems or questions, contact your health care provider. What can I expect after the procedure? After the procedure, it is common to have:  Pain in your knee.  Bruising and swelling on your knee, calf, and ankle for 3-4 days.  Fatigue. Follow these instructions at home: If you have a brace or immobilizer:  Wear the brace or immobilizer as told by your health care provider. Remove it  only as told by your health care provider.  Loosen the splint or immobilizer if your toes tingle, become numb, or turn cold and blue.  Keep the brace or immobilizer clean. Bathing  Do not take baths, swim, or use a hot tub until your health care provider approves. Ask your health care provider if you can take showers.  Keep your bandage (dressing) dry until your health care provider says that it can be removed. Cover it and your brace or immobilizer with a watertight covering when you take a shower. Incision care   Follow instructions from your health care provider about how to take care of your incision. Make sure you: ? Wash your hands with soap and water before you change your bandage (dressing). If soap and water are not available, use hand sanitizer. ? Change your dressing as told by your health care provider. ? Leave stitches (sutures), skin glue, or adhesive strips in place. These skin closures may need to stay in place for 2 weeks or longer. If adhesive strip edges start to loosen and curl up, you may trim the loose edges. Do not remove adhesive strips completely unless your health care provider tells you to do that.  Check your incision area every day for signs of infection. Check for: ? More  redness, swelling, or pain. ? More fluid or blood. ? Warmth. ? Pus or a bad smell. Managing pain, stiffness, and swelling   If directed, put ice on the affected area. ? If you have a removable brace or immobilizer, remove it as told by your health care provider. ? Put ice in a plastic bag. ? Place a towel between your skin and the bag or between your brace or immobilizer and the bag. ? Leave the ice on for 20 minutes, 2-3 times a day.  Move your toes often to avoid stiffness and to lessen swelling.  Raise (elevate) the injured area above the level of your heart while you are sitting or lying down. Driving  Do not drive until your health care provider approves. If you have a brace or  immobilizer on your leg, ask your health care provider when it is safe for you to drive.  Do not drive or use heavy machinery while taking prescription pain medicine. Activity  Rest as directed. Ask your health care provider what activities are safe for you.  Do physical therapy exercises as told by your health care provider. Physical therapy will help you regain strength and motion in your knee.  Follow instructions from your health care provider about: ? When you may start motion exercises. ? When you may start riding a stationary bike and doing other low-impact activities. ? When you may start to jog and do other high-impact activities. Safety  Do not use the injured limb to support your body weight until your health care provider says that you can. Use crutches as told by your health care provider. General instructions  Do not use any products that contain nicotine or tobacco, such as cigarettes and e-cigarettes. These can delay bone healing. If you need help quitting, ask your health care provider.  To prevent or treat constipation while you are taking prescription pain medicine, your health care provider may recommend that you: ? Drink enough fluid to keep your urine clear or pale yellow. ? Take over-the-counter or prescription medicines. ? Eat foods that are high in fiber, such as fresh fruits and vegetables, whole grains, and beans. ? Limit foods that are high in fat and processed sugars, such as fried and sweet foods.  Take over-the-counter and prescription medicines only as told by your health care provider.  Keep all follow-up visits as told by your health care provider. This is important. Contact a health care provider if:  You have more redness, swelling, or pain around an incision.  You have more fluid or blood coming from an incision.  Your incision feels warm to the touch.  You have a fever.  You have pain or swelling in your knee, and it gets worse.  You have  pain that does not get better with medicine. Get help right away if:  You have trouble breathing.  You have pus or a bad smell coming from an incision.  You have numbness and tingling near the knee joint. Summary  After the procedure, it is common to have knee pain with bruising and swelling on your knee, calf, and ankle.  Icing your knee and raising your leg above the level of your heart will help control the pain and the swelling.  Do physical therapy exercises as told by your health care provider. Physical therapy will help you regain strength and motion in your knee. This information is not intended to replace advice given to you by your health care provider. Make sure  you discuss any questions you have with your health care provider. Document Revised: 02/06/2017 Document Reviewed: 02/19/2016 Elsevier Patient Education  2020 [employer]  General Anesthesia, Adult, Care After This sheet gives you information about how to care for yourself after your procedure. Your health care provider may also give you more specific instructions. If you have problems or questions, contact your health care provider. What can I expect after the procedure? After the procedure, the following side effects are common:  Pain or discomfort at the IV site.  Nausea.  Vomiting.  Sore throat.  Trouble concentrating.  Feeling cold or chills.  Weak or tired.  Sleepiness and fatigue.  Soreness and body aches. These side effects can affect parts of the body that were not involved in surgery. Follow these instructions at home:  For at least 24 hours after the procedure:  Have a responsible adult stay with you. It is important to have someone help care for you until you are awake and alert.  Rest as needed.  Do not: ? Participate in activities in which you could fall or become injured. ? Drive. ? Use heavy machinery. ? Drink alcohol. ? Take sleeping pills or medicines that cause  drowsiness. ? Make important decisions or sign legal documents. ? Take care of children on your own. Eating and drinking  Follow any instructions from your health care provider about eating or drinking restrictions.  When you feel hungry, start by eating small amounts of foods that are soft and easy to digest (bland), such as toast. Gradually return to your regular diet.  Drink enough fluid to keep your urine pale yellow.  If you vomit, rehydrate by drinking water, juice, or clear broth. General instructions  If you have sleep apnea, surgery and certain medicines can increase your risk for breathing problems. Follow instructions from your health care provider about wearing your sleep device: ? Anytime you are sleeping, including during daytime naps. ? While taking prescription pain medicines, sleeping medicines, or medicines that make you drowsy.  Return to your normal activities as told by your health care provider. Ask your health care provider what activities are safe for you.  Take over-the-counter and prescription medicines only as told by your health care provider.  If you smoke, do not smoke without supervision.  Keep all follow-up visits as told by your health care provider. This is important. Contact a health care provider if:  You have nausea or vomiting that does not get better with medicine.  You cannot eat or drink without vomiting.  You have pain that does not get better with medicine.  You are unable to pass urine.  You develop a skin rash.  You have a fever.  You have redness around your IV site that gets worse. Get help right away if:  You have difficulty breathing.  You have chest pain.  You have blood in your urine or stool, or you vomit blood. Summary  After the procedure, it is common to have a sore throat or nausea. It is also common to feel tired.  Have a responsible adult stay with you for the first 24 hours after general anesthesia. It is  important to have someone help care for you until you are awake and alert.  When you feel hungry, start by eating small amounts of foods that are soft and easy to digest (bland), such as toast. Gradually return to your regular diet.  Drink enough fluid to keep your urine pale yellow.  Return  to your normal activities as told by your health care provider. Ask your health care provider what activities are safe for you. This information is not intended to replace advice given to you by your health care provider. Make sure you discuss any questions you have with your health care provider. Document Revised: 02/27/2017 Document Reviewed: 10/10/2016 Elsevier Patient Education  2020 [employer]. How to Use Chlorhexidine for Bathing Chlorhexidine gluconate (CHG) is a germ-killing (antiseptic) solution that is used to clean the skin. It can get rid of the bacteria that normally live on the skin and can keep them away for about 24 hours. To clean your skin with CHG, you may be given:  A CHG solution to use in the shower or as part of a sponge bath.  A prepackaged cloth that contains CHG. Cleaning your skin with CHG may help lower the risk for infection:  While you are staying in the intensive care unit of the hospital.  If you have a vascular access, such as a central line, to provide short-term or long-term access to your veins.  If you have a catheter to drain urine from your bladder.  If you are on a ventilator. A ventilator is a machine that helps you breathe by moving air in and out of your lungs.  After surgery. What are the risks? Risks of using CHG include:  A skin reaction.  Hearing loss, if CHG gets in your ears.  Eye injury, if CHG gets in your eyes and is not rinsed out.  The CHG product catching fire. Make sure that you avoid smoking and flames after applying CHG to your skin. Do not use CHG:  If you have a chlorhexidine allergy or have previously reacted to  chlorhexidine.  On babies younger than 32 months of age. How to use CHG solution  Use CHG only as told by your health care provider, and follow the instructions on the label.  Use the full amount of CHG as directed. Usually, this is one bottle. During a shower Follow these steps when using CHG solution during a shower (unless your health care provider gives you different instructions): 1. Start the shower. 2. Use your normal soap and shampoo to wash your face and hair. 3. Turn off the shower or move out of the shower stream. 4. Pour the CHG onto a clean washcloth. Do not use any type of brush or rough-edged sponge. 5. Starting at your neck, lather your body down to your toes. Make sure you follow these instructions: ? If you will be having surgery, pay special attention to the part of your body where you will be having surgery. Scrub this area for at least 1 minute. ? Do not use CHG on your head or face. If the solution gets into your ears or eyes, rinse them well with water. ? Avoid your genital area. ? Avoid any areas of skin that have broken skin, cuts, or scrapes. ? Scrub your back and under your arms. Make sure to wash skin folds. 6. Let the lather sit on your skin for 1-2 minutes or as long as told by your health care provider. 7. Thoroughly rinse your entire body in the shower. Make sure that all body creases and crevices are rinsed well. 8. Dry off with a clean towel. Do not put any substances on your body afterward--such as powder, lotion, or perfume--unless you are told to do so by your health care provider. Only use lotions that are recommended by the  manufacturer. 9. Put on clean clothes or pajamas. 10. If it is the night before your surgery, sleep in clean sheets.  During a sponge bath Follow these steps when using CHG solution during a sponge bath (unless your health care provider gives you different instructions): 1. Use your normal soap and shampoo to wash your face and  hair. 2. Pour the CHG onto a clean washcloth. 3. Starting at your neck, lather your body down to your toes. Make sure you follow these instructions: ? If you will be having surgery, pay special attention to the part of your body where you will be having surgery. Scrub this area for at least 1 minute. ? Do not use CHG on your head or face. If the solution gets into your ears or eyes, rinse them well with water. ? Avoid your genital area. ? Avoid any areas of skin that have broken skin, cuts, or scrapes. ? Scrub your back and under your arms. Make sure to wash skin folds. 4. Let the lather sit on your skin for 1-2 minutes or as long as told by your health care provider. 5. Using a different clean, wet washcloth, thoroughly rinse your entire body. Make sure that all body creases and crevices are rinsed well. 6. Dry off with a clean towel. Do not put any substances on your body afterward--such as powder, lotion, or perfume--unless you are told to do so by your health care provider. Only use lotions that are recommended by the manufacturer. 7. Put on clean clothes or pajamas. 8. If it is the night before your surgery, sleep in clean sheets. How to use CHG prepackaged cloths  Only use CHG cloths as told by your health care provider, and follow the instructions on the label.  Use the CHG cloth on clean, dry skin.  Do not use the CHG cloth on your head or face unless your health care provider tells you to.  When washing with the CHG cloth: ? Avoid your genital area. ? Avoid any areas of skin that have broken skin, cuts, or scrapes. Before surgery Follow these steps when using a CHG cloth to clean before surgery (unless your health care provider gives you different instructions): 1. Using the CHG cloth, vigorously scrub the part of your body where you will be having surgery. Scrub using a back-and-forth motion for 3 minutes. The area on your body should be completely wet with CHG when you are done  scrubbing. 2. Do not rinse. Discard the cloth and let the area air-dry. Do not put any substances on the area afterward, such as powder, lotion, or perfume. 3. Put on clean clothes or pajamas. 4. If it is the night before your surgery, sleep in clean sheets.  For general bathing Follow these steps when using CHG cloths for general bathing (unless your health care provider gives you different instructions). 1. Use a separate CHG cloth for each area of your body. Make sure you wash between any folds of skin and between your fingers and toes. Wash your body in the following order, switching to a new cloth after each step: ? The front of your neck, shoulders, and chest. ? Both of your arms, under your arms, and your hands. ? Your stomach and groin area, avoiding the genitals. ? Your right leg and foot. ? Your left leg and foot. ? The back of your neck, your back, and your buttocks. 2. Do not rinse. Discard the cloth and let the area air-dry. Do not  put any substances on your body afterward--such as powder, lotion, or perfume--unless you are told to do so by your health care provider. Only use lotions that are recommended by the manufacturer. 3. Put on clean clothes or pajamas. Contact a health care provider if:  Your skin gets irritated after scrubbing.  You have questions about using your solution or cloth. Get help right away if:  Your eyes become very red or swollen.  Your eyes itch badly.  Your skin itches badly and is red or swollen.  Your hearing changes.  You have trouble seeing.  You have swelling or tingling in your mouth or throat.  You have trouble breathing.  You swallow any chlorhexidine. Summary  Chlorhexidine gluconate (CHG) is a germ-killing (antiseptic) solution that is used to clean the skin. Cleaning your skin with CHG may help to lower your risk for infection.  You may be given CHG to use for bathing. It may be in a bottle or in a prepackaged cloth to use on  your skin. Carefully follow your health care provider's instructions and the instructions on the product label.  Do not use CHG if you have a chlorhexidine allergy.  Contact your health care provider if your skin gets irritated after scrubbing. This information is not intended to replace advice given to you by your health care provider. Make sure you discuss any questions you have with your health care provider. Document Revised: 05/13/2018 Document Reviewed: 01/22/2017 Elsevier Patient Education  2020 [employer].

## 2020-01-13 ENCOUNTER — Other Ambulatory Visit (HOSPITAL_COMMUNITY)
Admission: RE | Admit: 2020-01-13 | Discharge: 2020-01-13 | Disposition: A | Discharge status: Home / Self Care | Payer: Medicare Other | Source: Ambulatory Visit | Attending: Orthopedic Surgery | Admitting: Orthopedic Surgery

## 2020-01-13 ENCOUNTER — Other Ambulatory Visit: Payer: Self-pay

## 2020-01-13 ENCOUNTER — Encounter (HOSPITAL_COMMUNITY): Payer: Self-pay

## 2020-01-13 ENCOUNTER — Encounter (HOSPITAL_COMMUNITY)
Admission: RE | Admit: 2020-01-13 | Discharge: 2020-01-13 | Disposition: A | Discharge status: Home / Self Care | Payer: Medicare Other | Source: Ambulatory Visit | Attending: Orthopedic Surgery | Admitting: Orthopedic Surgery

## 2020-01-13 DIAGNOSIS — Z01812 Encounter for preprocedural laboratory examination: Secondary | ICD-10-CM | POA: Diagnosis present

## 2020-01-13 DIAGNOSIS — Z20822 Contact with and (suspected) exposure to covid-19: Secondary | ICD-10-CM | POA: Diagnosis not present

## 2020-01-13 LAB — CBC WITH DIFFERENTIAL/PLATELET
Abs Immature Granulocytes: 0.03 10*3/uL (ref 0.00–0.07)
Basophils Absolute: 0 10*3/uL (ref 0.0–0.1)
Basophils Relative: 0 %
Eosinophils Absolute: 0.6 10*3/uL — ABNORMAL HIGH (ref 0.0–0.5)
Eosinophils Relative: 6 %
HCT: 42.5 % (ref 36.0–46.0)
Hemoglobin: 14.4 g/dL (ref 12.0–15.0)
Immature Granulocytes: 0 %
Lymphocytes Relative: 22 %
Lymphs Abs: 2.1 10*3/uL (ref 0.7–4.0)
MCH: 31.3 pg (ref 26.0–34.0)
MCHC: 33.9 g/dL (ref 30.0–36.0)
MCV: 92.4 fL (ref 80.0–100.0)
Monocytes Absolute: 0.6 10*3/uL (ref 0.1–1.0)
Monocytes Relative: 6 %
Neutro Abs: 6.2 10*3/uL (ref 1.7–7.7)
Neutrophils Relative %: 66 %
Platelets: 212 10*3/uL (ref 150–400)
RBC: 4.6 MIL/uL (ref 3.87–5.11)
RDW: 12.6 % (ref 11.5–15.5)
WBC: 9.5 10*3/uL (ref 4.0–10.5)
nRBC: 0 % (ref 0.0–0.2)

## 2020-01-13 LAB — SARS CORONAVIRUS 2 (TAT 6-24 HRS): SARS Coronavirus 2: NEGATIVE

## 2020-01-13 LAB — BASIC METABOLIC PANEL
Anion gap: 8 (ref 5–15)
BUN: 16 mg/dL (ref 6–20)
CO2: 23 mmol/L (ref 22–32)
Calcium: 8.8 mg/dL — ABNORMAL LOW (ref 8.9–10.3)
Chloride: 106 mmol/L (ref 98–111)
Creatinine, Ser: 0.68 mg/dL (ref 0.44–1.00)
GFR, Estimated: >60 mL/min (ref >60)
Glucose, Bld: 107 mg/dL — ABNORMAL HIGH (ref 70–99)
Potassium: 3.4 mmol/L — ABNORMAL LOW (ref 3.5–5.1)
Sodium: 137 mmol/L (ref 135–145)

## 2020-01-16 NOTE — H&P (Signed)
Chief Complaint  Patient presents with  . Knee Pain    Lt knee        Encounter Diagnoses  Name Primary?  . Other tear of medial meniscus, current injury, left knee, subsequent encounter Yes  . Chronic pain of left knee   . Lumbar pain with radiation down left leg   . S/P arthroscopy of left knee      39 year old female who I did a right knee arthroscopy on back in 2019 presents with left knee medial pain mechanical symptoms failed conservative treatment  She also has a disc protrusion on the left side affecting the L5 root  She also has some cartilage damage on the articular surface of the medial femoral condyle however she wishes to proceed with surgery  We discussed her postop course which will include 4 to 6 weeks of postop rehab and then she will follow up with doctors who are treating her back and will continue with her pain management.  Her review shows that she has been on hydrocodone since May  We are going to start her off with 10 mg of hydrocodone postop for 7 days then return to the 5 mg  We will need 6 weeks rehab as stated  Patient understands risks and benefits of surgery  Plan to proceed with arthroscopy of the left knee medial meniscectomy  Past Medical History:  Diagnosis Date  . Arthritis   . Chronic pain   . Gastritis   . History of asthma   . History of blood transfusion    Past Surgical History:  Procedure Laterality Date  . ABDOMINAL HYSTERECTOMY    . DENTAL RESTORATION/EXTRACTION WITH X-RAY    . KNEE ARTHROSCOPY Right 01/25/2015   Procedure: ARTHROSCOPY KNEE;  Surgeon: Darreld Mclean, MD;  Location: [facility];  Service: Orthopedics;  Laterality: Right;  . KNEE ARTHROSCOPY WITH MEDIAL MENISECTOMY Right 01/25/2015   Procedure: KNEE ARTHROSCOPY WITH PARTIAL MEDIAL MENISECTOMY;  Surgeon: Darreld Mclean, MD;  Location: [facility];  Service: Orthopedics;  Laterality: Right;  . KNEE ARTHROSCOPY WITH MEDIAL MENISECTOMY Right 12/31/2017    Procedure: KNEE ARTHROSCOPY WITH MEDIAL MENISECTOMY;  Surgeon: Vickki Hearing, MD;  Location: [facility];  Service: Orthopedics;  Laterality: Right;  . TUBAL LIGATION     Family History  Problem Relation Age of Onset  . COPD Mother   . Breast cancer Mother   . Melanoma Father   . COPD Father    Social History   Tobacco Use  . Smoking status: Current Every Day Smoker    Packs/day: 0.50    Years: 18.00    Pack years: 9.00    Types: Cigarettes  . Smokeless tobacco: Never Used  Vaping Use  . Vaping Use: Never used  Substance Use Topics  . Alcohol use: No    Alcohol/week: 0.0 standard drinks  . Drug use: Yes    Types: Marijuana    Physical Exam Constitutional:      General: She is not in acute distress.    Appearance: She is well-developed.  Cardiovascular:     Comments: No peripheral edema Skin:    General: Skin is warm and dry.  Neurological:     Mental Status: She is alert and oriented to person, place, and time.     Sensory: No sensory deficit.     Coordination: Coordination normal.     Gait: Gait normal.     Deep Tendon Reflexes: Reflexes are normal and symmetric.    Left knee Tenderness  medial joint line no effusion Full range of motion Cruciate ligaments and collateral ligaments tested stable Muscle tone normal Skin clear  All images were reviewed I agree that she has medial arthritis and medial meniscal tear  Diagnosis torn left medial meniscus with some degenerative arthritis in the left knee plan for arthroscopy left knee partial medial meniscectomy  The procedure has been fully reviewed with the patient; The risks and benefits of surgery have been discussed and explained and understood. Alternative treatment has also been reviewed, questions were encouraged and answered. The postoperative plan is also been reviewed.

## 2020-01-17 ENCOUNTER — Encounter (HOSPITAL_COMMUNITY): Payer: Self-pay | Admitting: Orthopedic Surgery

## 2020-01-17 ENCOUNTER — Ambulatory Visit (HOSPITAL_COMMUNITY): Payer: Medicare Other | Admitting: Anesthesiology

## 2020-01-17 ENCOUNTER — Encounter (HOSPITAL_COMMUNITY)
Admission: RE | Disposition: A | Discharge status: Home / Self Care | Payer: Self-pay | Source: Home / Self Care | Attending: Orthopedic Surgery

## 2020-01-17 ENCOUNTER — Encounter: Payer: Self-pay | Admitting: Orthopedic Surgery

## 2020-01-17 ENCOUNTER — Ambulatory Visit (HOSPITAL_COMMUNITY)
Admission: RE | Admit: 2020-01-17 | Discharge: 2020-01-17 | Disposition: A | Discharge status: Home / Self Care | Payer: Medicare Other | Attending: Orthopedic Surgery | Admitting: Orthopedic Surgery

## 2020-01-17 DIAGNOSIS — X58XXXA Exposure to other specified factors, initial encounter: Secondary | ICD-10-CM | POA: Insufficient documentation

## 2020-01-17 DIAGNOSIS — S83242D Other tear of medial meniscus, current injury, left knee, subsequent encounter: Secondary | ICD-10-CM

## 2020-01-17 DIAGNOSIS — S83242A Other tear of medial meniscus, current injury, left knee, initial encounter: Secondary | ICD-10-CM

## 2020-01-17 DIAGNOSIS — F17210 Nicotine dependence, cigarettes, uncomplicated: Secondary | ICD-10-CM | POA: Insufficient documentation

## 2020-01-17 HISTORY — PX: MENISCUS REPAIR: SHX5179

## 2020-01-17 HISTORY — PX: KNEE ARTHROSCOPY WITH MEDIAL MENISECTOMY: SHX5651

## 2020-01-17 SURGERY — ARTHROSCOPY, KNEE, WITH MEDIAL MENISCECTOMY
Anesthesia: General | Site: Knee | Laterality: Left

## 2020-01-17 MED ORDER — PROPOFOL 10 MG/ML IV BOLUS
INTRAVENOUS | Status: AC
  Filled 2020-01-17: qty 20

## 2020-01-17 MED ORDER — HYDROCODONE-ACETAMINOPHEN 7.5-325 MG PO TABS
ORAL_TABLET | ORAL | Status: AC
  Filled 2020-01-17: qty 1

## 2020-01-17 MED ORDER — HYDROCODONE-ACETAMINOPHEN 7.5-325 MG PO TABS
1.0000 | ORAL_TABLET | Freq: Once | ORAL | Status: AC
  Administered 2020-01-17: 1 via ORAL

## 2020-01-17 MED ORDER — DEXAMETHASONE SODIUM PHOSPHATE 4 MG/ML IJ SOLN
INTRAMUSCULAR | Status: DC | PRN
  Administered 2020-01-17: 8 mg via INTRAVENOUS

## 2020-01-17 MED ORDER — FENTANYL CITRATE (PF) 100 MCG/2ML IJ SOLN
INTRAMUSCULAR | Status: DC | PRN
  Administered 2020-01-17 (×4): 25 ug via INTRAVENOUS

## 2020-01-17 MED ORDER — PROMETHAZINE HCL 25 MG/ML IJ SOLN: 6.2500 mg | INTRAMUSCULAR | Status: DC | PRN

## 2020-01-17 MED ORDER — ONDANSETRON HCL 4 MG/2ML IJ SOLN
INTRAMUSCULAR | Status: DC | PRN
  Administered 2020-01-17: 4 mg via INTRAVENOUS

## 2020-01-17 MED ORDER — EPINEPHRINE PF 1 MG/ML IJ SOLN
INTRAMUSCULAR | Status: AC
  Filled 2020-01-17: qty 5

## 2020-01-17 MED ORDER — MIDAZOLAM HCL 2 MG/2ML IJ SOLN
INTRAMUSCULAR | Status: AC
  Filled 2020-01-17: qty 2

## 2020-01-17 MED ORDER — KETOROLAC TROMETHAMINE 30 MG/ML IJ SOLN
INTRAMUSCULAR | Status: AC
  Filled 2020-01-17: qty 1

## 2020-01-17 MED ORDER — ORAL CARE MOUTH RINSE: 15.0000 mL | Freq: Once | OROMUCOSAL | Status: AC

## 2020-01-17 MED ORDER — MIDAZOLAM HCL 5 MG/5ML IJ SOLN
INTRAMUSCULAR | Status: DC | PRN
  Administered 2020-01-17: 2 mg via INTRAVENOUS

## 2020-01-17 MED ORDER — FENTANYL CITRATE (PF) 100 MCG/2ML IJ SOLN
INTRAMUSCULAR | Status: AC
  Filled 2020-01-17: qty 2

## 2020-01-17 MED ORDER — SODIUM CHLORIDE 0.9 % IR SOLN
Status: DC | PRN
  Administered 2020-01-17 (×4): 3000 mL

## 2020-01-17 MED ORDER — ONDANSETRON HCL 4 MG/2ML IJ SOLN
4.0000 mg | Freq: Once | INTRAMUSCULAR | Status: AC
  Administered 2020-01-17: 4 mg via INTRAVENOUS

## 2020-01-17 MED ORDER — PROPOFOL 10 MG/ML IV BOLUS
INTRAVENOUS | Status: DC | PRN
  Administered 2020-01-17: 200 mg via INTRAVENOUS
  Administered 2020-01-17: 50 mg via INTRAVENOUS

## 2020-01-17 MED ORDER — GLYCOPYRROLATE PF 0.2 MG/ML IJ SOSY
PREFILLED_SYRINGE | INTRAMUSCULAR | Status: AC
  Filled 2020-01-17: qty 1

## 2020-01-17 MED ORDER — CEFAZOLIN SODIUM-DEXTROSE 2-4 GM/100ML-% IV SOLN
INTRAVENOUS | Status: AC
  Filled 2020-01-17: qty 100

## 2020-01-17 MED ORDER — CEFAZOLIN SODIUM-DEXTROSE 2-4 GM/100ML-% IV SOLN
2.0000 g | INTRAVENOUS | Status: AC
  Administered 2020-01-17: 2 g via INTRAVENOUS

## 2020-01-17 MED ORDER — GLYCOPYRROLATE 0.2 MG/ML IJ SOLN
INTRAMUSCULAR | Status: DC | PRN
  Administered 2020-01-17: .2 mg via INTRAVENOUS

## 2020-01-17 MED ORDER — BUPIVACAINE-EPINEPHRINE (PF) 0.5% -1:200000 IJ SOLN
INTRAMUSCULAR | Status: AC
  Filled 2020-01-17: qty 60

## 2020-01-17 MED ORDER — LACTATED RINGERS IV SOLN: INTRAVENOUS | Status: DC | PRN

## 2020-01-17 MED ORDER — ONDANSETRON HCL 4 MG/2ML IJ SOLN
INTRAMUSCULAR | Status: AC
  Filled 2020-01-17: qty 2

## 2020-01-17 MED ORDER — LIDOCAINE HCL (CARDIAC) PF 100 MG/5ML IV SOSY
PREFILLED_SYRINGE | INTRAVENOUS | Status: DC | PRN
  Administered 2020-01-17: 100 mg via INTRAVENOUS

## 2020-01-17 MED ORDER — LIDOCAINE 2% (20 MG/ML) 5 ML SYRINGE
INTRAMUSCULAR | Status: AC
  Filled 2020-01-17: qty 5

## 2020-01-17 MED ORDER — MEPERIDINE HCL 50 MG/ML IJ SOLN: 6.2500 mg | INTRAMUSCULAR | Status: DC | PRN

## 2020-01-17 MED ORDER — DEXAMETHASONE SODIUM PHOSPHATE 10 MG/ML IJ SOLN
INTRAMUSCULAR | Status: AC
  Filled 2020-01-17: qty 1

## 2020-01-17 MED ORDER — KETOROLAC TROMETHAMINE 30 MG/ML IJ SOLN
30.0000 mg | Freq: Once | INTRAMUSCULAR | Status: AC
  Administered 2020-01-17: 30 mg via INTRAVENOUS

## 2020-01-17 MED ORDER — LACTATED RINGERS IV SOLN: Freq: Once | INTRAVENOUS | Status: AC

## 2020-01-17 MED ORDER — CHLORHEXIDINE GLUCONATE 0.12 % MT SOLN
15.0000 mL | Freq: Once | OROMUCOSAL | Status: AC
  Administered 2020-01-17: 15 mL via OROMUCOSAL

## 2020-01-17 MED ORDER — BUPIVACAINE-EPINEPHRINE (PF) 0.5% -1:200000 IJ SOLN
INTRAMUSCULAR | Status: DC | PRN
  Administered 2020-01-17 (×2): 30 mL

## 2020-01-17 MED ORDER — SODIUM CHLORIDE 0.9 % IR SOLN
Status: DC | PRN
  Administered 2020-01-17 (×2): 3000 mL

## 2020-01-17 MED ORDER — HYDROMORPHONE HCL 1 MG/ML IJ SOLN
0.2500 mg | INTRAMUSCULAR | Status: DC | PRN
  Administered 2020-01-17: 0.5 mg via INTRAVENOUS
  Filled 2020-01-17: qty 0.5

## 2020-01-17 SURGICAL SUPPLY — 53 items
ABLATOR ASPIRATE 50D MULTI-PRT (SURGICAL WAND) IMPLANT
APL PRP STRL LF DISP 70% ISPRP (MISCELLANEOUS) ×1
BAG HAMPER (MISCELLANEOUS) ×3 IMPLANT
BLADE SHAVER TORPEDO 4X13 (MISCELLANEOUS) ×3 IMPLANT
BLADE SURG SZ11 CARB STEEL (BLADE) ×3 IMPLANT
BNDG CMPR STD VLCR NS LF 5.8X6 (GAUZE/BANDAGES/DRESSINGS) ×1
BNDG ELASTIC 6X5.8 VLCR NS LF (GAUZE/BANDAGES/DRESSINGS) ×3 IMPLANT
CHLORAPREP W/TINT 26 (MISCELLANEOUS) ×3 IMPLANT
CLOTH BEACON ORANGE TIMEOUT ST (SAFETY) ×3 IMPLANT
COOLER ICEMAN CLASSIC (MISCELLANEOUS) ×3 IMPLANT
COVER WAND RF STERILE (DRAPES) ×6 IMPLANT
CUFF TOURN SGL QUICK 34 (TOURNIQUET CUFF)
CUFF TRNQT CYL 34X4.125X (TOURNIQUET CUFF) IMPLANT
DECANTER SPIKE VIAL GLASS SM (MISCELLANEOUS) ×6 IMPLANT
DRAPE HALF SHEET 40X57 (DRAPES) ×3 IMPLANT
GAUZE 4X4 16PLY RFD (DISPOSABLE) ×3 IMPLANT
GAUZE SPONGE 4X4 12PLY STRL (GAUZE/BANDAGES/DRESSINGS) ×3 IMPLANT
GAUZE SPONGE 4X4 16PLY XRAY LF (GAUZE/BANDAGES/DRESSINGS) ×3 IMPLANT
GAUZE XEROFORM 5X9 LF (GAUZE/BANDAGES/DRESSINGS) ×3 IMPLANT
GLOVE BIOGEL PI IND STRL 7.0 (GLOVE) ×2 IMPLANT
GLOVE BIOGEL PI INDICATOR 7.0 (GLOVE) ×4
GLOVE SKINSENSE NS SZ8.0 LF (GLOVE) ×2
GLOVE SKINSENSE STRL SZ8.0 LF (GLOVE) ×1 IMPLANT
GLOVE SS N UNI LF 8.5 STRL (GLOVE) ×3 IMPLANT
GOWN STRL REUS W/TWL LRG LVL3 (GOWN DISPOSABLE) ×3 IMPLANT
GOWN STRL REUS W/TWL XL LVL3 (GOWN DISPOSABLE) ×3 IMPLANT
IMPL MENISCAL CINCH II (Anchor) ×2 IMPLANT
IMPLANT MENISCAL CINCH II (Anchor) ×6 IMPLANT
IV NS IRRIG 3000ML ARTHROMATIC (IV SOLUTION) ×12 IMPLANT
KIT BLADEGUARD II DBL (SET/KITS/TRAYS/PACK) ×3 IMPLANT
KIT TURNOVER CYSTO (KITS) ×3 IMPLANT
MANIFOLD NEPTUNE II (INSTRUMENTS) ×3 IMPLANT
MARKER SKIN DUAL TIP RULER LAB (MISCELLANEOUS) ×3 IMPLANT
NEEDLE HYPO 18GX1.5 BLUNT FILL (NEEDLE) ×3 IMPLANT
NEEDLE HYPO 21X1.5 SAFETY (NEEDLE) ×3 IMPLANT
NEEDLE SPNL 18GX3.5 QUINCKE PK (NEEDLE) ×3 IMPLANT
NS IRRIG 1000ML POUR BTL (IV SOLUTION) ×3 IMPLANT
PACK ARTHRO LIMB DRAPE STRL (MISCELLANEOUS) ×3 IMPLANT
PAD ABD 5X9 TENDERSORB (GAUZE/BANDAGES/DRESSINGS) ×3 IMPLANT
PAD ABD 8X10 STRL (GAUZE/BANDAGES/DRESSINGS) ×3 IMPLANT
PAD ARMBOARD 7.5X6 YLW CONV (MISCELLANEOUS) ×3 IMPLANT
PAD COLD SHLDR WRAP-ON (PAD) ×3 IMPLANT
PADDING CAST COTTON 6X4 STRL (CAST SUPPLIES) ×3 IMPLANT
PORT APPOLLO RF 90DEGREE MULTI (SURGICAL WAND) IMPLANT
RESECTOR TORPEDO 4MM 13CM CVD (MISCELLANEOUS) ×3 IMPLANT
SET ARTHROSCOPY INST (INSTRUMENTS) ×3 IMPLANT
SET BASIN LINEN APH (SET/KITS/TRAYS/PACK) ×3 IMPLANT
SUT ETHILON 3 0 FSL (SUTURE) ×3 IMPLANT
SYR 10ML LL (SYRINGE) ×3 IMPLANT
SYR 30ML LL (SYRINGE) ×3 IMPLANT
TUBE CONNECTING 12'X1/4 (SUCTIONS) ×2
TUBE CONNECTING 12X1/4 (SUCTIONS) ×4 IMPLANT
TUBING IN/OUT FLOW W/MAIN PUMP (TUBING) ×3 IMPLANT

## 2020-01-17 NOTE — Interval H&P Note (Signed)
History and Physical Interval Note:  01/17/2020 7:20 AM  Courtney Patterson  has presented today for surgery, with the diagnosis of left knee arthroscopy medial meniscectomy.  The various methods of treatment have been discussed with the patient and family. After consideration of risks, benefits and other options for treatment, the patient has consented to  Procedure(s): KNEE ARTHROSCOPY WITH MEDIAL MENISECTOMY (Left) as a surgical intervention.  The patient's history has been reviewed, patient examined, no change in status, stable for surgery.  I have reviewed the patient's chart and labs.  Questions were answered to the patient's satisfaction.     Fuller Canada

## 2020-01-17 NOTE — Brief Op Note (Signed)
01/17/2020  10:09 AM  PATIENT:  Courtney Patterson  39 y.o. female  PRE-OPERATIVE DIAGNOSIS:  left knee arthroscopy medial meniscus tear  POST-OPERATIVE DIAGNOSIS: Same PROCEDURE:  Procedure(s): KNEE ARTHROSCOPY medial meniscus repair and partial medial meniscectomy left knee  Operative findings the patient had a displaced undersurface tear of the posterior horn of the medial meniscus with displacement into the notch the posterior root and superior part of the posterior horn was intact with a longitudinal vertical tear which was repaired with a meniscus cinch to  Complications meniscus cinch implant failure.  Implants 1 full meniscus cinch and then 1 partial meniscus cinch in the posterior capsule   SURGEON:  Surgeon(s) and Role:    * Vickki Hearing, MD - Primary  PHYSICIAN ASSISTANT:   ASSISTANTS: none   ANESTHESIA:   general  EBL:  none   BLOOD ADMINISTERED:none  DRAINS: none   LOCAL MEDICATIONS USED:  MARCAINE     SPECIMEN:  No Specimen  DISPOSITION OF SPECIMEN:  N/A  COUNTS:  YES  TOURNIQUET:  * Missing tourniquet times found for documented tourniquets in log: [id number] *  DICTATION: .Reubin Milan Dictation  PLAN OF CARE: Discharge to home after PACU  PATIENT DISPOSITION:  PACU - hemodynamically stable.   Delay start of Pharmacological VTE agent (>24hrs) due to surgical blood loss or risk of bleeding: not applicable

## 2020-01-17 NOTE — Op Note (Addendum)
01/17/2020 10:12 AM  Operative report  Preop diagnosis torn medial meniscus left knee  Postop diagnosis same  Procedure arthroscopy left knee, medial meniscus repair and partial medial meniscectomy  Operative findings torn medial meniscus posterior horn undersurface with displacement of that fragment into the notch, the remaining posterior horn superior surface was attached at the root with a vertical tear approximately a centimeter in length  The remaining portion of the joint were normal including the ACL and a preop exam under anesthesia revealed stable ACL as well  Surgeon Romeo Apple  Anesthesia General  Postop anesthetic 60 cc Marcaine with epinephrine  Complications one of the meniscal cinch is failed to deploy and the other half deployed and was left in the posterior capsule  Implant meniscal cinch x1+1/2 of an implant  Surgery was done as follows  The patient was seen in the preop area the surgical site was confirmed his left knee and marked chart was reviewed.  Patient was cleared for surgery.  Patient taken to the operating for general anesthesia in the supine position.  She had a brief exam under anesthesia confirming ligament stability  After sterile prep and drape and timeout a standard lateral portal was placed the scope was placed into the joint and the diagnostic arthroscopy was performed  Other than the medial meniscal tear the remaining articular structures were normal  Through a separate medial portal the knee was reexamined with the probe confirming her previous findings  There was a torn medial meniscus with a posterior horn undersurface fragment displaced into the notch and the remaining posterior horn attached at the root with a vertical tear approxi-1 cm in length.  I examined this area for quite a bit of time.  The tear seem to be incomplete but in my opinion would soon become a complete tear and I decided to put a meniscal suture in it.  I deployed the  first suture and the implant failed I cut the sutures and placed a second implant.  The first meniscal cinch peek implant is still in the joint at the posterior capsule.  I got a good reduction and upon probing the meniscus was stable  I irrigated the joint and closed the portals with 3-0 nylon sutures then injected 60 cc of Marcaine with epinephrine and applied a sterile dressing and a Cryo/Cuff  This will alter the postop plan I would like the patient to limit her knee flexion to 90 degrees for the first 6 weeks otherwise she can weight-bear as tolerated to create some hoop stresses on the repair

## 2020-01-17 NOTE — Transfer of Care (Signed)
Immediate Anesthesia Transfer of Care Note  Patient: Courtney Patterson  Procedure(s) Performed: KNEE ARTHROSCOPY WITH PARTIAL MEDIAL MENISECTOMY (Left Knee) LEFT  MENISCUS REPAIR (Left Knee)  Patient Location: PACU  Anesthesia Type:General  Level of Consciousness: awake, alert , oriented and patient cooperative  Airway & Oxygen Therapy: Patient Spontanous Breathing  Post-op Assessment: Report given to RN, Post -op Vital signs reviewed and stable and Patient moving all extremities  Post vital signs: Reviewed and stable  Last Vitals:  Vitals Value Taken Time  BP    Temp    Pulse 48 01/17/20 1010  Resp 20 01/17/20 1010  SpO2 84 % 01/17/20 1010  Vitals shown include unvalidated device data.  Last Pain:  Vitals:   01/17/20 0722  TempSrc: Oral  PainSc: 0-No pain      Patients Stated Pain Goal: 8 (01/17/20 4098)  Complications: No complications documented.

## 2020-01-17 NOTE — Anesthesia Procedure Notes (Signed)
Procedure Name: LMA Insertion Performed by: Idaly Verret L, CRNA Pre-anesthesia Checklist: Patient identified, Emergency Drugs available, Suction available, Patient being monitored and Timeout performed Patient Re-evaluated:Patient Re-evaluated prior to induction Oxygen Delivery Method: Circle system utilized Preoxygenation: Pre-oxygenation with 100% oxygen Induction Type: IV induction LMA: LMA inserted LMA Size: 4.0 Number of attempts: 1 Placement Confirmation: positive ETCO2,  CO2 detector and breath sounds checked- equal and bilateral Tube secured with: Tape Dental Injury: Teeth and Oropharynx as per pre-operative assessment        

## 2020-01-17 NOTE — Anesthesia Preprocedure Evaluation (Signed)
Anesthesia Evaluation  Patient identified by MRN, date of birth, ID band Patient awake    Reviewed: Allergy & Precautions, NPO status , Patient's Chart, lab work & pertinent test results  History of Anesthesia Complications Negative for: history of anesthetic complications  Airway Mallampati: II  TM Distance: >3 FB Neck ROM: Full    Dental  (+) Dental Advisory Given, Edentulous Upper, Missing   Pulmonary asthma , Current Smoker and Patient abstained from smoking.,    Pulmonary exam normal breath sounds clear to auscultation       Cardiovascular Exercise Tolerance: Good Normal cardiovascular exam Rhythm:Regular Rate:Normal     Neuro/Psych negative neurological ROS  negative psych ROS   GI/Hepatic negative GI ROS, (+)     substance abuse  marijuana use,   Endo/Other  negative endocrine ROS  Renal/GU negative Renal ROS     Musculoskeletal  (+) Arthritis ,   Abdominal   Peds  Hematology   Anesthesia Other Findings   Reproductive/Obstetrics                             Anesthesia Physical Anesthesia Plan  ASA: II  Anesthesia Plan: General   Post-op Pain Management:    Induction: Intravenous  PONV Risk Score and Plan: Treatment may vary due to age or medical condition, Ondansetron, Dexamethasone and Midazolam  Airway Management Planned: LMA  Additional Equipment:   Intra-op Plan:   Post-operative Plan: Extubation in OR  Informed Consent: I have reviewed the patients History and Physical, chart, labs and discussed the procedure including the risks, benefits and alternatives for the proposed anesthesia with the patient or authorized representative who has indicated his/her understanding and acceptance.     Dental advisory given  Plan Discussed with: CRNA and Surgeon  Anesthesia Plan Comments:         Anesthesia Quick Evaluation

## 2020-01-17 NOTE — Anesthesia Postprocedure Evaluation (Signed)
Anesthesia Post Note  Patient: Courtney Patterson  Procedure(s) Performed: KNEE ARTHROSCOPY WITH PARTIAL MEDIAL MENISECTOMY (Left Knee) LEFT  MENISCUS REPAIR (Left Knee)  Patient location during evaluation: PACU Anesthesia Type: General Level of consciousness: awake, oriented, awake and alert and patient cooperative Pain management: pain level controlled Vital Signs Assessment: post-procedure vital signs reviewed and stable Respiratory status: spontaneous breathing, respiratory function stable and nonlabored ventilation Cardiovascular status: blood pressure returned to baseline and stable Postop Assessment: no headache and no backache Anesthetic complications: no   No complications documented.   Last Vitals:  Vitals:   01/17/20 0722  BP: 103/75  Pulse: 68  Resp: 20  Temp: 36.5 C  SpO2: 99%    Last Pain:  Vitals:   01/17/20 0722  TempSrc: Oral  PainSc: 0-No pain                 Brynda Peon

## 2020-01-17 NOTE — Progress Notes (Signed)
Pt was instructed how to use incentive spirometer and demonstrated use of device.

## 2020-01-18 ENCOUNTER — Encounter (HOSPITAL_COMMUNITY): Payer: Self-pay | Admitting: Orthopedic Surgery

## 2020-01-23 ENCOUNTER — Other Ambulatory Visit: Payer: Self-pay | Admitting: Orthopedic Surgery

## 2020-01-23 DIAGNOSIS — Z9889 Other specified postprocedural states: Secondary | ICD-10-CM | POA: Insufficient documentation

## 2020-01-23 DIAGNOSIS — G8918 Other acute postprocedural pain: Secondary | ICD-10-CM

## 2020-01-23 MED ORDER — HYDROCODONE-ACETAMINOPHEN 7.5-325 MG PO TABS: 1.0000 | ORAL_TABLET | Freq: Four times a day (QID) | ORAL | 0 refills | Status: DC | PRN

## 2020-01-23 MED ORDER — IBUPROFEN 800 MG PO TABS: 800.0000 mg | ORAL_TABLET | Freq: Three times a day (TID) | ORAL | 0 refills | Status: DC | PRN

## 2020-01-23 NOTE — Telephone Encounter (Signed)
Patient called for refill; status/post knee surgery 01/17/20: HYDROcodone-acetaminophen (NORCO) 10-325 MG tablet 42 tablet  -[facility]  -patient aware of appointment on Wednesday, 01/25/20

## 2020-01-23 NOTE — Progress Notes (Signed)
Meds ordered this encounter  Medications  . DISCONTD: HYDROcodone-acetaminophen (NORCO) 7.5-325 MG tablet    Sig: Take 1 tablet by mouth every 6 (six) hours as needed for moderate pain.    Dispense:  28 tablet    Refill:  0  . ibuprofen (ADVIL) 800 MG tablet    Sig: Take 1 tablet (800 mg total) by mouth every 8 (eight) hours as needed.    Dispense:  30 tablet    Refill:  0  . HYDROcodone-acetaminophen (NORCO) 7.5-325 MG tablet    Sig: Take 1 tablet by mouth every 6 (six) hours as needed for moderate pain.    Dispense:  28 tablet    Refill:  0

## 2020-01-25 ENCOUNTER — Telehealth: Payer: Self-pay | Admitting: Orthopedic Surgery

## 2020-01-25 ENCOUNTER — Ambulatory Visit: Payer: Medicare Other | Admitting: Orthopedic Surgery

## 2020-01-25 DIAGNOSIS — Z9889 Other specified postprocedural states: Secondary | ICD-10-CM

## 2020-01-25 NOTE — Telephone Encounter (Signed)
Pt was a no show.  I called and LM earlier today.  I called back this afternoon and spoke to her daughter who  said Tymika was sick and would have her call us back.  I told her to have Madaline call ASAP due to Dr Romeo Apple needs to see her tomorrow.  She said she would do this

## 2020-01-25 NOTE — Progress Notes (Deleted)
No chief complaint on file.  POD 8   01/17/2020 10:12 AM   Operative report   Preop diagnosis torn medial meniscus left knee   Postop diagnosis same   Procedure arthroscopy left knee, medial meniscus repair and partial medial meniscectomy   Operative findings torn medial meniscus posterior horn undersurface with displacement of that fragment into the notch, the remaining posterior horn superior surface was attached at the root with a vertical tear approximately a centimeter in length   The remaining portion of the joint were normal including the ACL and a preop exam under anesthesia revealed stable ACL as well

## 2020-01-26 ENCOUNTER — Encounter: Payer: Self-pay | Admitting: Orthopedic Surgery

## 2020-02-07 NOTE — Telephone Encounter (Signed)
01/26/20  No show letter was sent to patient asking her to call the office to reschedule this appointment

## 2020-02-08 ENCOUNTER — Ambulatory Visit (INDEPENDENT_AMBULATORY_CARE_PROVIDER_SITE_OTHER): Payer: Medicare Other | Admitting: Orthopedic Surgery

## 2020-02-08 ENCOUNTER — Other Ambulatory Visit: Payer: Self-pay

## 2020-02-08 ENCOUNTER — Encounter: Payer: Self-pay | Admitting: Orthopedic Surgery

## 2020-02-08 DIAGNOSIS — Z9889 Other specified postprocedural states: Secondary | ICD-10-CM

## 2020-02-08 MED ORDER — HYDROCODONE-ACETAMINOPHEN 5-325 MG PO TABS
1.0000 | ORAL_TABLET | Freq: Four times a day (QID) | ORAL | 0 refills | Status: DC | PRN
Start: 2020-02-08 — End: 2020-02-15

## 2020-02-08 NOTE — Addendum Note (Signed)
Addended byCaffie Damme on: 02/08/2020 12:10 PM   Modules accepted: Orders

## 2020-02-08 NOTE — Patient Instructions (Signed)
-   Start therapy

## 2020-02-08 NOTE — Progress Notes (Signed)
Chief Complaint  Patient presents with  . Routine Post Op    left knee arthroscopy 01/17/20    Meniscus repair and partial menisectomy   POD 22  Complains of stiffness  She can extend the knee up to 5 degrees and flex it to 115 degrees  Her knee shows no effusion she complains of pain on the medial joint line  Recommend physical therapy follow-up in 3 weeks  Meds ordered this encounter  Medications  . HYDROcodone-acetaminophen (NORCO/VICODIN) 5-325 MG tablet    Sig: Take 1 tablet by mouth every 6 (six) hours as needed for up to 7 days for moderate pain.    Dispense:  28 tablet    Refill:  0   H/o chronic opioid therapy prior to surgery

## 2020-02-15 ENCOUNTER — Other Ambulatory Visit: Payer: Self-pay | Admitting: Orthopedic Surgery

## 2020-02-15 MED ORDER — HYDROCODONE-ACETAMINOPHEN 5-325 MG PO TABS: 1.0000 | ORAL_TABLET | Freq: Four times a day (QID) | ORAL | 0 refills | Status: DC | PRN

## 2020-02-15 NOTE — Telephone Encounter (Signed)
Patient requests refill: HYDROcodone-acetaminophen (NORCO/VICODIN) 5-325 MG tablet 28 tablet  -[facility]

## 2020-02-15 NOTE — Telephone Encounter (Signed)
Surgery is past a month   Send to Dr Hilda Lias after this one for chronic opioid therapy

## 2020-02-20 ENCOUNTER — Telehealth: Payer: Self-pay | Admitting: Orthopedic Surgery

## 2020-02-20 DIAGNOSIS — G8929 Other chronic pain: Secondary | ICD-10-CM

## 2020-02-20 NOTE — Telephone Encounter (Signed)
I received a call from [place] at [facility] in [place].  She said that Tonja asks that we send a new PT referral some place else.  [facility] states that they accept pt's Medicare but they do not file with Medicaid.  Fannie wants to go to a facility that will file both insurances for her.  Can you do this for her?  Thanks

## 2020-02-21 NOTE — Telephone Encounter (Signed)
Sent to [facility]

## 2020-02-22 ENCOUNTER — Other Ambulatory Visit: Payer: Self-pay | Admitting: Orthopedic Surgery

## 2020-02-22 MED ORDER — HYDROCODONE-ACETAMINOPHEN 5-325 MG PO TABS: 1.0000 | ORAL_TABLET | Freq: Four times a day (QID) | ORAL | 0 refills | Status: DC | PRN

## 2020-02-22 NOTE — Telephone Encounter (Signed)
Patient requests refill on Hydrocodone/Acetinophen 5-325 mgs.  Qty 28  Sig: Take 1 tablet by mouth every 6 (six) hours as needed for up to 7 days for moderate pain.  Patient states she uses [facility]

## 2020-02-23 ENCOUNTER — Ambulatory Visit (HOSPITAL_COMMUNITY): Payer: Medicare Other | Admitting: Physical Therapy

## 2020-02-29 ENCOUNTER — Other Ambulatory Visit: Payer: Self-pay | Admitting: Orthopedic Surgery

## 2020-02-29 NOTE — Telephone Encounter (Signed)
Patient just called to request refill - states was at an appointment until now and 'just able to call in': HYDROcodone-acetaminophen (NORCO/VICODIN) 5-325 MG tablet 28 tablet  [facility]

## 2020-02-29 NOTE — Telephone Encounter (Signed)
Likely will be Monday for this to be sent in / closed tomorrow and Friday for holiday

## 2020-03-01 ENCOUNTER — Telehealth (HOSPITAL_COMMUNITY): Payer: Self-pay | Admitting: Physical Therapy

## 2020-03-01 ENCOUNTER — Ambulatory Visit (HOSPITAL_COMMUNITY): Payer: Medicare Other | Admitting: Physical Therapy

## 2020-03-01 MED ORDER — HYDROCODONE-ACETAMINOPHEN 5-325 MG PO TABS: 1.0000 | ORAL_TABLET | Freq: Four times a day (QID) | ORAL | 0 refills | Status: DC | PRN

## 2020-03-01 NOTE — Telephone Encounter (Signed)
pt called to cx this appt due to she had other appts and she got confused with this appt.

## 2020-03-08 ENCOUNTER — Other Ambulatory Visit: Payer: Self-pay

## 2020-03-08 ENCOUNTER — Encounter: Payer: Self-pay | Admitting: Orthopedic Surgery

## 2020-03-08 ENCOUNTER — Ambulatory Visit (INDEPENDENT_AMBULATORY_CARE_PROVIDER_SITE_OTHER): Payer: Medicare Other | Admitting: Orthopedic Surgery

## 2020-03-08 VITALS — BP 109/72 | HR 68 | Ht 74.0 in

## 2020-03-08 DIAGNOSIS — Z9889 Other specified postprocedural states: Secondary | ICD-10-CM

## 2020-03-08 MED ORDER — HYDROCODONE-ACETAMINOPHEN 5-325 MG PO TABS: 1.0000 | ORAL_TABLET | Freq: Four times a day (QID) | ORAL | 0 refills | Status: DC | PRN

## 2020-03-08 NOTE — Progress Notes (Signed)
39 year old female status post meniscal repair and partial meniscectomy   She was sent for physical therapy but has not had any to date   Encounter Diagnosis  Name Primary?  . S/P arthroscopy of left knee 01/17/20  Yes   Meds ordered this encounter  Medications  . HYDROcodone-acetaminophen (NORCO/VICODIN) 5-325 MG tablet    Sig: Take 1 tablet by mouth every 6 (six) hours as needed for up to 7 days for moderate pain.    Dispense:  28 tablet    Refill:  0    Her flexion is 100 degrees her extension is full  She should go to therapy and try to work out the slightest bit of 2530 degrees of flexion and then see [place] back  Patient will have 10 physical therapy visits then call us to schedule an appointment after the 10 visits have been completed

## 2020-03-08 NOTE — Patient Instructions (Signed)
Schedule your nest appointment after you have had 10 PT treatments

## 2020-03-15 ENCOUNTER — Other Ambulatory Visit: Payer: Self-pay | Admitting: Orthopedic Surgery

## 2020-03-15 MED ORDER — HYDROCODONE-ACETAMINOPHEN 5-325 MG PO TABS: 1.0000 | ORAL_TABLET | Freq: Four times a day (QID) | ORAL | 0 refills | Status: DC | PRN

## 2020-03-15 NOTE — Telephone Encounter (Signed)
Patient requests refill on Hydrocodone/Acetaminophen 5-325 mgs.  Qty  28  Sig: Take 1 tablet by mouth every 6 (six) hours as needed for up to 7 days for moderate pain.  Patient states she uses [facility]

## 2020-03-22 ENCOUNTER — Other Ambulatory Visit: Payer: Self-pay | Admitting: Orthopedic Surgery

## 2020-03-22 NOTE — Telephone Encounter (Signed)
:  Patient request refill   HYDROcodone-Acetaminophen (Tab) NORCO/VICODIN 5-325 MG Take 1 tablet by mouth every 6 (six) hours as needed for up to 7 days for moderate pain.

## 2020-03-29 ENCOUNTER — Other Ambulatory Visit: Payer: Self-pay | Admitting: Orthopedic Surgery

## 2020-04-05 ENCOUNTER — Encounter: Payer: Self-pay | Admitting: Orthopedic Surgery

## 2020-04-05 ENCOUNTER — Ambulatory Visit (INDEPENDENT_AMBULATORY_CARE_PROVIDER_SITE_OTHER): Payer: Medicare Other | Admitting: Orthopedic Surgery

## 2020-04-05 ENCOUNTER — Other Ambulatory Visit: Payer: Self-pay

## 2020-04-05 DIAGNOSIS — F411 Generalized anxiety disorder: Secondary | ICD-10-CM | POA: Insufficient documentation

## 2020-04-05 DIAGNOSIS — F3112 Bipolar disorder, current episode manic without psychotic features, moderate: Secondary | ICD-10-CM | POA: Insufficient documentation

## 2020-04-05 DIAGNOSIS — G8929 Other chronic pain: Secondary | ICD-10-CM

## 2020-04-05 DIAGNOSIS — N393 Stress incontinence (female) (male): Secondary | ICD-10-CM | POA: Insufficient documentation

## 2020-04-05 DIAGNOSIS — M5430 Sciatica, unspecified side: Secondary | ICD-10-CM | POA: Insufficient documentation

## 2020-04-05 DIAGNOSIS — E559 Vitamin D deficiency, unspecified: Secondary | ICD-10-CM | POA: Insufficient documentation

## 2020-04-05 DIAGNOSIS — M25562 Pain in left knee: Secondary | ICD-10-CM

## 2020-04-05 DIAGNOSIS — Z862 Personal history of diseases of the blood and blood-forming organs and certain disorders involving the immune mechanism: Secondary | ICD-10-CM | POA: Insufficient documentation

## 2020-04-05 DIAGNOSIS — Z6820 Body mass index (BMI) 20.0-20.9, adult: Secondary | ICD-10-CM | POA: Insufficient documentation

## 2020-04-05 DIAGNOSIS — IMO0002 Reserved for concepts with insufficient information to code with codable children: Secondary | ICD-10-CM | POA: Insufficient documentation

## 2020-04-05 DIAGNOSIS — F329 Major depressive disorder, single episode, unspecified: Secondary | ICD-10-CM | POA: Insufficient documentation

## 2020-04-05 DIAGNOSIS — G894 Chronic pain syndrome: Secondary | ICD-10-CM | POA: Insufficient documentation

## 2020-04-05 DIAGNOSIS — G8918 Other acute postprocedural pain: Secondary | ICD-10-CM

## 2020-04-05 DIAGNOSIS — F5102 Adjustment insomnia: Secondary | ICD-10-CM | POA: Insufficient documentation

## 2020-04-05 MED ORDER — IBUPROFEN 800 MG PO TABS: 800.0000 mg | ORAL_TABLET | Freq: Three times a day (TID) | ORAL | 0 refills | Status: AC | PRN

## 2020-04-05 MED ORDER — HYDROCODONE-ACETAMINOPHEN 5-325 MG PO TABS: ORAL_TABLET | ORAL | 0 refills | Status: DC

## 2020-04-05 NOTE — Progress Notes (Signed)
Chief Complaint  Patient presents with  . Post-op Follow-up    Left knee 01/17/20   Courtney Patterson is status post meniscal repair check she complains of anterior knee pain which she did not have preop  I looked at her MRI and her Intra-Op photos and she has no chondromalacia in her patella  Most likely secondary to exercises during her rehab  She has about a 10 degree difference in terms of flexion which is approximately 150 degrees versus 140 degrees on the left  Otherwise the knee looks great no pain medially no swelling of the joint  She will continue her ibuprofen and hydrocodone (hydrocodone was chronic opioid therapy for chronic pain prior to surgery)  I asked her to stop therapy no home exercises  Continue follow-up in 6 weeks

## 2020-04-05 NOTE — Patient Instructions (Signed)
No therapy

## 2020-04-12 ENCOUNTER — Other Ambulatory Visit: Payer: Self-pay | Admitting: Orthopedic Surgery

## 2020-04-12 DIAGNOSIS — M25562 Pain in left knee: Secondary | ICD-10-CM

## 2020-04-12 DIAGNOSIS — G8929 Other chronic pain: Secondary | ICD-10-CM

## 2020-04-19 ENCOUNTER — Other Ambulatory Visit: Payer: Self-pay | Admitting: Orthopedic Surgery

## 2020-04-19 DIAGNOSIS — M25562 Pain in left knee: Secondary | ICD-10-CM

## 2020-04-19 DIAGNOSIS — G8929 Other chronic pain: Secondary | ICD-10-CM

## 2020-04-26 ENCOUNTER — Other Ambulatory Visit: Payer: Self-pay | Admitting: Orthopedic Surgery

## 2020-04-26 DIAGNOSIS — G8929 Other chronic pain: Secondary | ICD-10-CM

## 2020-05-03 ENCOUNTER — Telehealth: Payer: Self-pay | Admitting: Radiology

## 2020-05-03 ENCOUNTER — Other Ambulatory Visit: Payer: Self-pay | Admitting: Orthopedic Surgery

## 2020-05-03 DIAGNOSIS — M25562 Pain in left knee: Secondary | ICD-10-CM

## 2020-05-03 DIAGNOSIS — G8929 Other chronic pain: Secondary | ICD-10-CM

## 2020-05-03 NOTE — Telephone Encounter (Signed)
Called her again and advised her, she states that's fine put in referral told her they will call her from [facility]

## 2020-05-03 NOTE — Telephone Encounter (Signed)
-----   Message from Vickki Hearing, MD sent at 05/03/2020 12:36 PM EST ----- Time for pain management

## 2020-05-03 NOTE — Telephone Encounter (Signed)
I called her to discuss left message for her to call me back.

## 2020-05-08 ENCOUNTER — Other Ambulatory Visit: Payer: Self-pay | Admitting: Orthopedic Surgery

## 2020-05-08 DIAGNOSIS — G8929 Other chronic pain: Secondary | ICD-10-CM

## 2020-05-08 NOTE — Telephone Encounter (Signed)
Patient called to request refill:  HYDROcodone-acetaminophen (NORCO/VICODIN) 5-325 MG tablet 28 tablet  Pharmacy is [facility]  -patient acknowledged referral to pain clinic; states has not yet heard; aware in progress.

## 2020-05-09 NOTE — Telephone Encounter (Signed)
Left message for patient to call [facility] to schedule, they have been calling her  Gave her the number told her to let me know when she is going.

## 2020-05-10 ENCOUNTER — Other Ambulatory Visit: Payer: Self-pay | Admitting: Orthopedic Surgery

## 2020-05-10 ENCOUNTER — Telehealth: Payer: Self-pay | Admitting: Orthopedic Surgery

## 2020-05-10 DIAGNOSIS — G8929 Other chronic pain: Secondary | ICD-10-CM

## 2020-05-10 DIAGNOSIS — M25562 Pain in left knee: Secondary | ICD-10-CM

## 2020-05-10 NOTE — Telephone Encounter (Signed)
Pain Management called her back and she has an appt on 05/19/20 at 12:00 pm   Please call her back

## 2020-05-17 ENCOUNTER — Encounter: Payer: Self-pay | Admitting: Orthopedic Surgery

## 2020-05-17 ENCOUNTER — Other Ambulatory Visit: Payer: Self-pay

## 2020-05-17 ENCOUNTER — Ambulatory Visit (INDEPENDENT_AMBULATORY_CARE_PROVIDER_SITE_OTHER): Payer: Medicare Other | Admitting: Orthopedic Surgery

## 2020-05-17 VITALS — BP 110/76 | HR 79 | Ht 74.0 in | Wt 167.0 lb

## 2020-05-17 DIAGNOSIS — M7652 Patellar tendinitis, left knee: Secondary | ICD-10-CM

## 2020-05-17 DIAGNOSIS — Z9889 Other specified postprocedural states: Secondary | ICD-10-CM | POA: Diagnosis not present

## 2020-05-17 DIAGNOSIS — G8929 Other chronic pain: Secondary | ICD-10-CM | POA: Diagnosis not present

## 2020-05-17 DIAGNOSIS — M25562 Pain in left knee: Secondary | ICD-10-CM

## 2020-05-17 DIAGNOSIS — M5440 Lumbago with sciatica, unspecified side: Secondary | ICD-10-CM | POA: Insufficient documentation

## 2020-05-17 MED ORDER — HYDROCODONE-ACETAMINOPHEN 5-325 MG PO TABS: 1.0000 | ORAL_TABLET | ORAL | 0 refills | Status: AC | PRN

## 2020-05-17 MED ORDER — METHYLPREDNISOLONE 8 MG PO TABS: 8.0000 mg | ORAL_TABLET | Freq: Every day | ORAL | 0 refills | Status: AC

## 2020-05-17 NOTE — Progress Notes (Signed)
Chief Complaint  Patient presents with  . Post-op Follow-up    Left knee states worse     Encounter Diagnoses  Name Primary?  . Chronic pain of left knee   . S/P arthroscopy of left knee 01/17/20, medial meniscus repair   . Patellar tendinitis of left knee Yes    Courtney Patterson is 4 months postop from medial meniscus repair which healed well and she did well however some point doing therapy she developed anterior knee pain which is declared itself as patellar tendinitis.  She is on chronic opioid therapy for chronic pain prior to surgery and is still requiring opioids to get good pain relief  We have decided to send her to pain management  However she still has the anterior knee pain she has decreased extension of the knee with tenderness directly over the patellar tendon no swelling no warmth erythema or medial side is completely asymptomatic there is no joint effusion  Diagnosis patellar tendinitis  Recommend she stop ibuprofen start Medrol 8 mg for the next 14 days  Follow-up in 6 weeks  Referral to pain management  Meds ordered this encounter  Medications  . methylPREDNISolone (MEDROL) 8 MG tablet    Sig: Take 1 tablet (8 mg total) by mouth daily for 14 days.    Dispense:  14 tablet    Refill:  0  . HYDROcodone-acetaminophen (NORCO/VICODIN) 5-325 MG tablet    Sig: Take 1 tablet by mouth every 4 (four) hours as needed for up to 7 days for moderate pain.    Dispense:  28 tablet    Refill:  0

## 2020-06-28 ENCOUNTER — Ambulatory Visit: Payer: Medicare Other | Admitting: Orthopedic Surgery

## 2020-06-28 DIAGNOSIS — Z9889 Other specified postprocedural states: Secondary | ICD-10-CM

## 2020-06-28 NOTE — Progress Notes (Deleted)
Chief Complaint  Patient presents with  . Post-op Follow-up      Left knee states worse           Encounter Diagnoses  Name Primary?  . Chronic pain of left knee    . S/P arthroscopy of left knee 01/17/20, medial meniscus repair    . Patellar tendinitis of left knee Yes      Courtney Patterson is 4 months postop from medial meniscus repair which healed well and she did well however some point doing therapy she developed anterior knee pain which is declared itself as patellar tendinitis.  She is on chronic opioid therapy for chronic pain prior to surgery and is still requiring opioids to get good pain relief   We have decided to send her to pain management   However she still has the anterior knee pain she has decreased extension of the knee with tenderness directly over the patellar tendon no swelling no warmth erythema or medial side is completely asymptomatic there is no joint effusion   Diagnosis patellar tendinitis   Recommend she stop ibuprofen start Medrol 8 mg for the next 14 days   Follow-up in 6 weeks

## 2021-10-17 ENCOUNTER — Other Ambulatory Visit: Payer: Self-pay | Admitting: Cardiology

## 2021-10-17 ENCOUNTER — Ambulatory Visit (INDEPENDENT_AMBULATORY_CARE_PROVIDER_SITE_OTHER): Payer: Medicare Other | Admitting: Cardiology

## 2021-10-17 ENCOUNTER — Ambulatory Visit (INDEPENDENT_AMBULATORY_CARE_PROVIDER_SITE_OTHER): Payer: Medicare Other

## 2021-10-17 ENCOUNTER — Encounter: Payer: Self-pay | Admitting: Cardiology

## 2021-10-17 ENCOUNTER — Telehealth: Payer: Self-pay | Admitting: Cardiology

## 2021-10-17 VITALS — BP 102/82 | HR 67 | Ht 75.0 in | Wt 149.0 lb

## 2021-10-17 DIAGNOSIS — R002 Palpitations: Secondary | ICD-10-CM

## 2021-10-17 DIAGNOSIS — R0602 Shortness of breath: Secondary | ICD-10-CM

## 2021-10-17 NOTE — Patient Instructions (Addendum)
Medication Instructions:  Your physician recommends that you continue on your current medications as directed. Please refer to the Current Medication list given to you today.  Labwork: none  Testing/Procedures: Your physician has requested that you have an echocardiogram. Echocardiography is a painless test that uses sound waves to create images of your heart. It provides your doctor with information about the size and shape of your heart and how well your heart's chambers and valves are working. This procedure takes approximately one hour. There are no restrictions for this procedure. Your physician has recommended that you wear a Zio monitor.   This monitor is a medical device that records the heart's electrical activity. Doctors most often use these monitors to diagnose arrhythmias. Arrhythmias are problems with the speed or rhythm of the heartbeat. The monitor is a small device applied to your chest. You can wear one while you do your normal daily activities. While wearing this monitor if you have any symptoms to push the button and record what you felt. Once you have worn this monitor for the period of time provider prescribed (for 14 days), you will return the monitor device in the postage paid box. Once it is returned they will download the data collected and provide [place] with a report which the provider will then review and we will call you with those results. Important tips:  Avoid showering during the first 24 hours of wearing the monitor. Avoid excessive sweating to help maximize wear time. Do not submerge the device, no hot tubs, and no swimming pools. Keep any lotions or oils away from the patch. After 24 hours you may shower with the patch on. Take brief showers with your back facing the shower head.  Do not remove patch once it has been placed because that will interrupt data and decrease adhesive wear time. Push the button when you have any symptoms and write down what you were  feeling. Once you have completed wearing your monitor, remove and place into box which has postage paid and place in your outgoing mailbox.  If for some reason you have misplaced your box then call our office and we can provide another box and/or mail it off for you.  Follow-Up: Your physician recommends that you schedule a follow-up appointment in: pending  Any Other Special Instructions Will Be Listed Below (If Applicable).  If you need a refill on your cardiac medications before your next appointment, please call your pharmacy.

## 2021-10-17 NOTE — Progress Notes (Signed)
Cardiology Office Note  Date: 10/17/2021   ID: Courtney Patterson, DOB [birth year], MRN [id number]  PCP:  Smith Robert, MD  Cardiologist:  Nona Dell, MD Electrophysiologist:  None   Chief Complaint  Patient presents with   Palpitations    History of Present Illness: Courtney Patterson is a 41 y.o. female referred for cardiology consultation by Dr. Bryna Colander for cardiac evaluation with reported history of palpitations.  She states that over the last few years she has experienced an intermittent sense of heart skipping and sometimes rapid heartbeat.  This can happen in the evening when she is still, sometimes randomly during the day without any obvious provocation.  Generally this last for no more than a few minutes.  She does feel short of breath with the symptoms.  She has never had syncope and does not report any exertional chest pain.  This has generally been more frequent over the last year, at least once a week.  She states that Dr. Bryna Colander told her that her heart rate was irregular.  I do not have any outside tracings for review.  ECG today shows sinus rhythm with vertical axis.  No obvious family history of cardiac arrhythmia or congenital heart disease.  Past Medical History:  Diagnosis Date   Anxiety    Arthritis    Bipolar disorder (HCC)    Chronic pain    GERD (gastroesophageal reflux disease)    History of asthma    History of blood transfusion    Mixed hyperlipidemia    Seasonal allergies     Past Surgical History:  Procedure Laterality Date   ABDOMINAL HYSTERECTOMY     DENTAL RESTORATION/EXTRACTION WITH X-RAY     KNEE ARTHROSCOPY Right 01/25/2015   Procedure: ARTHROSCOPY KNEE;  Surgeon: Darreld Mclean, MD;  Location: [facility];  Service: Orthopedics;  Laterality: Right;   KNEE ARTHROSCOPY WITH MEDIAL MENISECTOMY Right 01/25/2015   Procedure: KNEE ARTHROSCOPY WITH PARTIAL MEDIAL MENISECTOMY;  Surgeon: Darreld Mclean, MD;  Location: [facility];  Service: Orthopedics;   Laterality: Right;   KNEE ARTHROSCOPY WITH MEDIAL MENISECTOMY Right 12/31/2017   Procedure: KNEE ARTHROSCOPY WITH MEDIAL MENISECTOMY;  Surgeon: Vickki Hearing, MD;  Location: [facility];  Service: Orthopedics;  Laterality: Right;   KNEE ARTHROSCOPY WITH MEDIAL MENISECTOMY Left 01/17/2020   Procedure: KNEE ARTHROSCOPY WITH PARTIAL MEDIAL MENISECTOMY;  Surgeon: Vickki Hearing, MD;  Location: [facility];  Service: Orthopedics;  Laterality: Left;   MENISCUS REPAIR Left 01/17/2020   Procedure: LEFT  MENISCUS REPAIR;  Surgeon: Vickki Hearing, MD;  Location: [facility];  Service: Orthopedics;  Laterality: Left;   TUBAL LIGATION      Current Outpatient Medications  Medication Sig Dispense Refill   azelastine (ASTELIN) 0.1 % nasal spray Place 1 spray into both nostrils 2 (two) times daily.     clonazePAM (KLONOPIN) 0.5 MG tablet Take 0.5 mg by mouth 2 (two) times daily.     fluticasone (FLONASE ALLERGY RELIEF) 50 MCG/ACT nasal spray Place 1 spray into both nostrils as needed.     gabapentin (NEURONTIN) 300 MG capsule TAKE ONE CAPSULE BY MOUTH EVERY 8 HOURS FOR 30 DAYS     ibuprofen (ADVIL) 800 MG tablet Take 1 tablet (800 mg total) by mouth every 8 (eight) hours as needed. 30 tablet 0   omeprazole (PRILOSEC) 20 MG capsule Take 1 capsule by mouth 2 (two) times daily.     oxyCODONE-acetaminophen (PERCOCET) 10-325 MG tablet Take 1 tablet by mouth 4 (four) times  daily as needed.     sertraline (ZOLOFT) 50 MG tablet Take 50 mg by mouth daily.  2   tiZANidine (ZANAFLEX) 4 MG tablet Take 4 mg by mouth every 8 (eight) hours as needed.     traZODone (DESYREL) 100 MG tablet Take 100 mg by mouth at bedtime.     No current facility-administered medications for this visit.   Allergies:  Patient has no known allergies.   Social History: The patient  reports that she has been smoking cigarettes. She has a 9.00 pack-year smoking history. She has never used smokeless tobacco. She reports current drug use. Drug:  Marijuana. She reports that she does not drink alcohol.   Family History: The patient's family history includes Breast cancer in her mother; COPD in her father and mother; Melanoma in her father.   ROS: No orthopnea or PND.  Physical Exam: VS:  BP 102/82   Pulse 67   Ht 6\' 3"  (1.905 m)   Wt 149 lb (67.6 kg)   LMP 05/23/2015   SpO2 97%   BMI 18.62 kg/m , BMI Body mass index is 18.62 kg/m.  Wt Readings from Last 3 Encounters:  10/17/21 149 lb (67.6 kg)  05/17/20 167 lb (75.8 kg)  01/17/20 167 lb 15.9 oz (76.2 kg)    General: Tall statured woman, appears comfortable at rest. HEENT: Conjunctiva and lids normal, oropharynx clear. Neck: Supple, no elevated JVP or carotid bruits, no thyromegaly. Lungs: Clear to auscultation, nonlabored breathing at rest. Cardiac: Regular rate and rhythm, no S3 or significant systolic murmur, no pericardial rub. Abdomen: Soft, nontender, bowel sounds present. Extremities: No pitting edema, distal pulses 2+. Skin: Warm and dry. Musculoskeletal: No kyphosis. Neuropsychiatric: Alert and oriented x3, affect grossly appropriate.  ECG:   No prior tracings available for review.  Recent Labwork:  June 2023: Cholesterol 120, HDL 39, triglycerides 69, LDL 66, BUN 17, creatinine 0.62, potassium 4.4, AST 7, ALT 4, hemoglobin 14.0, platelets 218, vitamin D 20  Other Studies Reviewed Today:  No previous cardiac testing for review.  Assessment and Plan:  Intermittent palpitations associated with shortness of breath as discussed above.  She is in sinus rhythm today by ECG, vertical axis.  I reviewed her most recent lab work we discussed getting an echocardiogram to ensure normal cardiac structure and function as well as a 14-day Zio patch to evaluate heart rhythm more carefully.  Would mainly want to exclude paroxysmal atrial fibrillation or other arrhythmias that would require specific treatment.  Medication Adjustments/Labs and Tests Ordered: Current  medicines are reviewed at length with the patient today.  Concerns regarding medicines are outlined above.   Tests Ordered: Orders Placed This Encounter  Procedures   EKG 12-Lead   ECHOCARDIOGRAM COMPLETE    Medication Changes: No orders of the defined types were placed in this encounter.   Disposition:  Follow up  test results.  Signed, July 2023, MD, [facility] 10/17/2021 3:31 PM    [facility] [address] [place], [place], [place] Phone: [phone]; Fax: [phone]

## 2021-10-17 NOTE — Telephone Encounter (Signed)
PERCERT:  2 D Echo dx: SOB  -[facility]  14 Day ZIO XT dx: palpitations

## 2021-10-31 ENCOUNTER — Other Ambulatory Visit: Payer: Self-pay | Admitting: Cardiology

## 2021-10-31 DIAGNOSIS — I499 Cardiac arrhythmia, unspecified: Secondary | ICD-10-CM

## 2021-10-31 DIAGNOSIS — R0602 Shortness of breath: Secondary | ICD-10-CM

## 2021-11-04 ENCOUNTER — Other Ambulatory Visit: Payer: Medicare Other

## 2021-11-14 ENCOUNTER — Telehealth: Payer: Self-pay | Admitting: *Deleted

## 2021-11-14 MED ORDER — METOPROLOL SUCCINATE ER 25 MG PO TB24: 12.5000 mg | ORAL_TABLET | Freq: Every day | ORAL | 1 refills | Status: DC

## 2021-11-14 NOTE — Telephone Encounter (Signed)
Patient informed and verbalized understanding of plan. Says she prefer to start toprol xl 12.5 mg daily due to palpitations being bothersome Copy sent to PCP

## 2021-11-14 NOTE — Telephone Encounter (Signed)
-----   Message from Jonelle Sidle, MD sent at 11/07/2021  4:53 PM EDT ----- Results reviewed.  Overall, monitor was reassuring.  She did have rare atrial and ventricular ectopy which she could sense as palpitations.  Also very brief episodes of SVT, one lasted about 18 beats.  This is also a possible source of her palpitations, but is not dangerous to her.  Importantly, no atrial fibrillation was documented.  Could consider a low-dose beta-blocker such as Toprol-XL 12.5 mg daily if symptoms are particularly bothersome, but this is not absolutely essential at this time.  Follow-up on echocardiogram which is pending.

## 2021-11-21 ENCOUNTER — Ambulatory Visit: Payer: Medicare Other | Attending: Cardiology

## 2021-11-21 DIAGNOSIS — I499 Cardiac arrhythmia, unspecified: Secondary | ICD-10-CM | POA: Diagnosis not present

## 2021-11-21 DIAGNOSIS — R0602 Shortness of breath: Secondary | ICD-10-CM | POA: Diagnosis not present

## 2021-11-22 LAB — ECHOCARDIOGRAM COMPLETE
AR max vel: 2.72 cm2
AV Area VTI: 2.6 cm2
AV Area mean vel: 2.77 cm2
AV Mean grad: 1.9 mmHg
AV Peak grad: 3.5 mmHg
Ao pk vel: 0.93 m/s
Area-P 1/2: 2.76 cm2
Calc EF: 54.1 %
MV M vel: 2.8 m/s
MV Peak grad: 31.2 mmHg
S' Lateral: 3.06 cm
Single Plane A2C EF: 54.6 %
Single Plane A4C EF: 54.9 %

## 2022-05-01 ENCOUNTER — Other Ambulatory Visit: Payer: Self-pay | Admitting: Cardiology

## 2022-05-27 ENCOUNTER — Ambulatory Visit: Payer: Medicare Other | Admitting: Cardiology

## 2022-05-27 NOTE — Progress Notes (Deleted)
    Cardiology Office Note  Date: 05/27/2022   ID: Courtney Patterson, DOB October 13, 1980, MRN [id number]  History of Present Illness: Courtney Patterson is a 42 y.o. female seen in consultation in August 2023 for evaluation of palpitations.    Physical Exam: VS:  LMP 05/23/2015 , BMI There is no height or weight on file to calculate BMI.  Wt Readings from Last 3 Encounters:  10/17/21 149 lb (67.6 kg)  05/17/20 167 lb (75.8 kg)  01/17/20 167 lb 15.9 oz (76.2 kg)    General: Patient appears comfortable at rest. HEENT: Conjunctiva and lids normal, oropharynx clear with moist mucosa. Neck: Supple, no elevated JVP or carotid bruits, no thyromegaly. Lungs: Clear to auscultation, nonlabored breathing at rest. Cardiac: Regular rate and rhythm, no S3 or significant systolic murmur, no pericardial rub. Abdomen: Soft, nontender, no hepatomegaly, bowel sounds present, no guarding or rebound. Extremities: No pitting edema, distal pulses 2+. Skin: Warm and dry. Musculoskeletal: No kyphosis. Neuropsychiatric: Alert and oriented x3, affect grossly appropriate.  ECG:  An ECG dated 10/17/2021 was personally reviewed today and demonstrated:  Sinus rhythm with vertical axis.  Labwork:  No interval lab work for review today.  Other Studies Reviewed Today:  Cardiac monitor August 2023: ZIO XT reviewed.  13 days, 9 hours analyzed.   Predominant rhythm is sinus with heart rate ranging from 40 bpm up to 172 bpm and average heart rate 75 bpm. There were rare PACs including atrial couplets and triplets representing less than 1% total beats. There were rare PVCs including ventricular couplets representing less than 1% total beats. Ten brief episodes of PSVT were noted, the longest of which lasted 18 beats.  There were no sustained atrial arrhythmias. No significant pauses. Patient triggered events correlated with sinus rhythm.  Echocardiogram 11/21/2021:  1. Left ventricular ejection fraction, by  estimation, is 50 to 55%. The  left ventricle has low normal function. The left ventricle has no regional  wall motion abnormalities. Left ventricular diastolic parameters were  normal. The average left ventricular   global longitudinal strain is -20.1 %. The global longitudinal strain is  normal.   2. Right ventricular systolic function is normal. The right ventricular  size is normal. There is normal pulmonary artery systolic pressure.   3. The mitral valve is normal in structure. Mild mitral valve  regurgitation. No evidence of mitral stenosis.   4. The aortic valve was not well visualized. There is mild calcification  of the aortic valve. There is mild thickening of the aortic valve. Aortic  valve regurgitation is not visualized. No aortic stenosis is present.   5. The inferior vena cava is normal in size with greater than 50%  respiratory variability, suggesting right atrial pressure of 3 mmHg.   Assessment and Plan:  History of palpitations with cardiac monitor in August of last year showing rare atrial and ventricular ectopy as well as brief episodes of PSVT.  She has been on Toprol-XL 12.5 mg daily.  Disposition:  Follow up {follow up:15908}  Signed, Satira Sark, M.D., F.A.C.C.

## 2022-10-26 ENCOUNTER — Other Ambulatory Visit: Payer: Self-pay | Admitting: Cardiology

## 2022-12-15 ENCOUNTER — Other Ambulatory Visit (HOSPITAL_COMMUNITY): Payer: Self-pay | Admitting: Nurse Practitioner

## 2022-12-15 DIAGNOSIS — Z1231 Encounter for screening mammogram for malignant neoplasm of breast: Secondary | ICD-10-CM

## 2023-02-16 ENCOUNTER — Inpatient Hospital Stay (HOSPITAL_COMMUNITY): Admission: RE | Admit: 2023-02-16 | Payer: 59 | Source: Ambulatory Visit

## 2023-02-16 ENCOUNTER — Encounter (HOSPITAL_COMMUNITY): Payer: Self-pay

## 2023-02-23 ENCOUNTER — Inpatient Hospital Stay (HOSPITAL_COMMUNITY): Admission: RE | Admit: 2023-02-23 | Payer: 59 | Source: Ambulatory Visit

## 2023-02-23 ENCOUNTER — Encounter (HOSPITAL_COMMUNITY): Payer: Self-pay

## 2023-02-23 ENCOUNTER — Other Ambulatory Visit (HOSPITAL_COMMUNITY): Payer: Self-pay | Admitting: Nurse Practitioner

## 2023-02-23 DIAGNOSIS — Z1231 Encounter for screening mammogram for malignant neoplasm of breast: Secondary | ICD-10-CM

## 2023-03-02 ENCOUNTER — Encounter (HOSPITAL_COMMUNITY): Payer: Self-pay

## 2023-03-05 ENCOUNTER — Inpatient Hospital Stay (HOSPITAL_COMMUNITY): Admission: RE | Admit: 2023-03-05 | Payer: 59 | Source: Ambulatory Visit

## 2023-03-05 DIAGNOSIS — Z1231 Encounter for screening mammogram for malignant neoplasm of breast: Secondary | ICD-10-CM

## 2023-03-09 ENCOUNTER — Other Ambulatory Visit (HOSPITAL_COMMUNITY): Payer: Self-pay | Admitting: Nurse Practitioner

## 2023-03-09 ENCOUNTER — Encounter (HOSPITAL_COMMUNITY): Payer: Self-pay

## 2023-03-09 ENCOUNTER — Ambulatory Visit (HOSPITAL_COMMUNITY)
Admission: RE | Admit: 2023-03-09 | Discharge: 2023-03-09 | Disposition: A | Discharge status: Home / Self Care | Payer: 59 | Source: Ambulatory Visit | Attending: Nurse Practitioner | Admitting: Nurse Practitioner

## 2023-03-09 DIAGNOSIS — Z1231 Encounter for screening mammogram for malignant neoplasm of breast: Secondary | ICD-10-CM | POA: Diagnosis present

## 2023-04-23 ENCOUNTER — Other Ambulatory Visit: Payer: Self-pay | Admitting: Cardiology

## 2023-10-23 ENCOUNTER — Other Ambulatory Visit: Payer: Self-pay | Admitting: Cardiology

## 2023-12-02 ENCOUNTER — Other Ambulatory Visit: Payer: Self-pay | Admitting: Cardiology
